# Patient Record
Sex: Female | Born: 1980 | Race: White | Hispanic: No | Marital: Single | Smoking: Former smoker
Health system: Southern US, Community
[De-identification: ages and names within clinical notes are randomized; demographics above are authoritative.]

## PROBLEM LIST (undated history)

## (undated) DIAGNOSIS — J40 Bronchitis, not specified as acute or chronic: Secondary | ICD-10-CM

## (undated) DIAGNOSIS — K8020 Calculus of gallbladder without cholecystitis without obstruction: Secondary | ICD-10-CM

## (undated) DIAGNOSIS — F419 Anxiety disorder, unspecified: Secondary | ICD-10-CM

## (undated) DIAGNOSIS — B379 Candidiasis, unspecified: Secondary | ICD-10-CM

## (undated) HISTORY — DX: Candidiasis, unspecified: B37.9

## (undated) HISTORY — PX: TUBAL LIGATION: SHX77

---

## 2001-12-04 ENCOUNTER — Emergency Department (HOSPITAL_COMMUNITY): Admission: EM | Admit: 2001-12-04 | Discharge: 2001-12-04 | Payer: Self-pay | Admitting: *Deleted

## 2002-08-14 ENCOUNTER — Other Ambulatory Visit: Admission: RE | Admit: 2002-08-14 | Discharge: 2002-08-14 | Payer: Self-pay | Admitting: *Deleted

## 2002-11-22 ENCOUNTER — Observation Stay (HOSPITAL_COMMUNITY): Admission: EM | Admit: 2002-11-22 | Discharge: 2002-11-22 | Payer: Self-pay | Admitting: Emergency Medicine

## 2002-11-22 ENCOUNTER — Encounter: Payer: Self-pay | Admitting: *Deleted

## 2003-02-07 ENCOUNTER — Encounter: Payer: Self-pay | Admitting: *Deleted

## 2003-02-07 ENCOUNTER — Ambulatory Visit (HOSPITAL_COMMUNITY): Admission: RE | Admit: 2003-02-07 | Discharge: 2003-02-07 | Payer: Self-pay | Admitting: *Deleted

## 2003-04-01 ENCOUNTER — Ambulatory Visit (HOSPITAL_COMMUNITY): Admission: RE | Admit: 2003-04-01 | Discharge: 2003-04-01 | Payer: Self-pay | Admitting: *Deleted

## 2003-04-01 ENCOUNTER — Encounter: Payer: Self-pay | Admitting: *Deleted

## 2003-04-21 ENCOUNTER — Ambulatory Visit (HOSPITAL_COMMUNITY): Admission: AD | Admit: 2003-04-21 | Discharge: 2003-04-21 | Payer: Self-pay | Admitting: *Deleted

## 2003-06-26 ENCOUNTER — Inpatient Hospital Stay (HOSPITAL_COMMUNITY): Admission: AD | Admit: 2003-06-26 | Discharge: 2003-06-28 | Payer: Self-pay | Admitting: *Deleted

## 2003-10-25 ENCOUNTER — Emergency Department (HOSPITAL_COMMUNITY): Admission: EM | Admit: 2003-10-25 | Discharge: 2003-10-25 | Payer: Self-pay | Admitting: Emergency Medicine

## 2004-03-09 ENCOUNTER — Emergency Department (HOSPITAL_COMMUNITY): Admission: EM | Admit: 2004-03-09 | Discharge: 2004-03-09 | Payer: Self-pay | Admitting: Emergency Medicine

## 2005-12-17 ENCOUNTER — Emergency Department (HOSPITAL_COMMUNITY): Admission: EM | Admit: 2005-12-17 | Discharge: 2005-12-17 | Payer: Self-pay | Admitting: Emergency Medicine

## 2005-12-18 ENCOUNTER — Emergency Department (HOSPITAL_COMMUNITY): Admission: EM | Admit: 2005-12-18 | Discharge: 2005-12-18 | Payer: Self-pay | Admitting: Family Medicine

## 2006-04-15 ENCOUNTER — Emergency Department (HOSPITAL_COMMUNITY): Admission: EM | Admit: 2006-04-15 | Discharge: 2006-04-15 | Payer: Self-pay | Admitting: Family Medicine

## 2008-03-01 ENCOUNTER — Emergency Department (HOSPITAL_COMMUNITY): Admission: EM | Admit: 2008-03-01 | Discharge: 2008-03-01 | Payer: Self-pay | Admitting: Emergency Medicine

## 2008-07-08 ENCOUNTER — Other Ambulatory Visit: Admission: RE | Admit: 2008-07-08 | Discharge: 2008-07-08 | Payer: Self-pay | Admitting: Obstetrics & Gynecology

## 2008-10-18 ENCOUNTER — Emergency Department (HOSPITAL_COMMUNITY): Admission: EM | Admit: 2008-10-18 | Discharge: 2008-10-18 | Payer: Self-pay | Admitting: Emergency Medicine

## 2010-06-08 ENCOUNTER — Emergency Department (HOSPITAL_COMMUNITY): Admission: EM | Admit: 2010-06-08 | Discharge: 2010-06-08 | Payer: Self-pay | Admitting: Emergency Medicine

## 2011-01-06 LAB — BASIC METABOLIC PANEL
BUN: 13 mg/dL (ref 6–23)
CO2: 24 mEq/L (ref 19–32)
Calcium: 8.9 mg/dL (ref 8.4–10.5)
Chloride: 107 mEq/L (ref 96–112)
Creatinine, Ser: 0.59 mg/dL (ref 0.4–1.2)
GFR calc Af Amer: >60 mL/min (ref >60)
GFR calc non Af Amer: >60 mL/min (ref >60)
Glucose, Bld: 127 mg/dL — ABNORMAL HIGH (ref 70–99)
Potassium: 3.7 mEq/L (ref 3.5–5.1)
Sodium: 136 mEq/L (ref 135–145)

## 2011-01-06 LAB — URINALYSIS, ROUTINE W REFLEX MICROSCOPIC
Bilirubin Urine: NEGATIVE
Glucose, UA: NEGATIVE mg/dL
Hgb urine dipstick: NEGATIVE
Ketones, ur: NEGATIVE mg/dL
Nitrite: NEGATIVE
Protein, ur: NEGATIVE mg/dL
Specific Gravity, Urine: 1.015 (ref 1.005–1.030)
Urobilinogen, UA: 2 mg/dL — ABNORMAL HIGH (ref 0.0–1.0)
pH: 7.5 (ref 5.0–8.0)

## 2011-01-06 LAB — URINE MICROSCOPIC-ADD ON

## 2011-03-11 NOTE — H&P (Signed)
NAME:  Diana Holmes, Diana Holmes                       ACCOUNT NO.:  [id number]   MEDICAL RECORD NO.:  [id number]                   PATIENT TYPE:  INP   LOCATION:  A426                                 FACILITY:  [facility]   PHYSICIAN:  Langley Gauss, M.D.                DATE OF BIRTH:  1980-11-28   DATE OF ADMISSION:  06/26/2003  DATE OF DISCHARGE:  06/28/2003                                HISTORY & PHYSICAL   HISTORY OF PRESENT ILLNESS:  A 30 year old gravida 3 para 2 at 39+ weeks  gestation is admitted for induction of labor secondary to psychosocial  factors.  In addition, the patient is having prodromal labor symptoms with  onset of uterine contractions.  The patient originally was scheduled for  induction on June 26, 2003 but she did present on this day in the early  stages of labor.  The patient's prenatal course complicated by findings of a  positive GC culture in her boyfriend.  The boyfriend did notify her of this  and thus the patient was treated with Rocephin 250 mg IM x1 on April 24, 2003.  She, however, on that date her cervix tested negative for GC and chlamydia.  The patient is a nonsmoker.  She is B negative blood type.   PAST MEDICAL HISTORY:   ALLERGIES:  No known drug allergies.   CURRENT MEDICATIONS:  Prenatal vitamins.   OBSTETRICAL HISTORY:  1. Vaginal delivery, 7 pound 8 ounce infant, October 2000.  2. October 2001, vaginal delivery, 6 pound 6 ounce infant.   SOCIAL HISTORY:  Nonsmoker.  She would like to utilize the patch.  She will  plan on breastfeeding and utilize Dr. Milford Cage for pediatric newborn purposes.   PHYSICAL EXAMINATION:  GENERAL:  Pleasant white female.  VITAL SIGNS:  Blood pressure 123/81, pulse 102, respiratory rate is 18.  HEENT:  Negative, no adenopathy.  NECK:  Supple.  Thyroid is nonpalpable.  LUNGS:  Clear.  CARDIOVASCULAR:  Regular rate and rhythm.  ABDOMEN:  Soft and nontender.  No surgical scars are identified.  She is  vertex  presentation by Leopold's maneuvers with a fundal height of 36 cm.  PELVIC:  Normal external genitalia.  No lesions or ulcerations identified,  no vaginal bleeding or leakage of fluid.  Cervix 3 cm dilated, 80% effaced,  0 station, vertex presentation confirmed.  External fetal monitor reveals a  reassuring fetal heart rate.    ASSESSMENT:  Plan on proceeding with amniotomy, placing of fetal scalp  electrode during labor.  The patient has had epidural with previous labor  and delivery and she would like placement of epidural with onset of active  labor.  Langley Gauss, M.D.    DC/MEDQ  D:  07/03/2003  T:  07/03/2003  Job:  [id number]

## 2011-03-11 NOTE — Op Note (Signed)
   NAME:  Diana Holmes, Diana Holmes                       ACCOUNT NO.:  [id number]   MEDICAL RECORD NO.:  [id number]                   PATIENT TYPE:  INP   LOCATION:  A426                                 FACILITY:  [facility]   PHYSICIAN:  Langley Gauss, M.D.                DATE OF BIRTH:  [birth year]   DATE OF PROCEDURE:  06/26/2003  DATE OF DISCHARGE:                                 OPERATIVE REPORT   PROCEDURE:  Epidural.   DESCRIPTION OF PROCEDURE:  Appropriate informed consent was obtained.  The  patient was in the seated position.  Bony landmarks were identified.  The  patient was sterilely prepped and draped at the L2-L3 interspace, and 5 cc  of 1% lidocaine was injected at this site to raise a small skin wheal.  The  17-gauge Tuohy-Schliff needle was then utilized with loss of resistance and  an air filled glass syringe to identify entry into the epidural space on the  first attempt without difficulty.  Initial test dose of 5 cc of 1.5%  lidocaine plus epinephrine was injected through the epidural needle.  The  epidural catheter was then inserted to a depth of 5 cm.  The needle was  removed.  Aspiration test was negative.  A second test dose of 2 cc of 1.5%  lidocaine plus epinephrine was injected through the epidural catheter.  No  signs of CSF or intravascular injection obtained, thus, the patient was  connected to the infusion pump containing the standard mixture.  She will be  treated with a bolus of 10 cc followed by continuous infusion rate of 12 cc  per hour thereafter.  The catheter was secured into place.  The patient was  returned to the lateral supine position at which time she was having  evidence of an adequate bilateral block setting up.  Fetal heart rate  remained reassuring.                                               Langley Gauss, M.D.    DC/MEDQ  D:  06/26/2003  T:  06/27/2003  Job:  [id number]

## 2011-03-11 NOTE — Discharge Summary (Signed)
   NAME:  Diana Holmes, Couse                       ACCOUNT NO.:  [id number]   MEDICAL RECORD NO.:  [id number]                   PATIENT TYPE:  INP   LOCATION:  A426                                 FACILITY:  [facility]   PHYSICIAN:  Langley Gauss, M.D.                DATE OF BIRTH:  Feb 24, 1981   DATE OF ADMISSION:  06/26/2003  DATE OF DISCHARGE:  06/28/2003                                 DISCHARGE SUMMARY   DIAGNOSIS:  Term pregnancy, delivered.   PROCEDURES:  Placement of epidural on June 26, 2003.   DISPOSITION:  At the time of discharge the patient is given a copy of  standarized discharge instructions.  She will follow up in the office in  four weeks' time to discuss birth control options.   DISCHARGE MEDICATIONS:  Vicoprofen is not available for this patient as she  states she has difficulty swallowing pills.  Thus, she is given a  prescription for Tylenol No. 3 elixir.   PERTINENT LABORATORY STUDIES:  Admission hemoglobin and hematocrit  11.1/31.9, with a white count of 11.4.  On postpartum day #1, 10.7/32 with a  white count of 14.5.  The patient is noted B negative.  She did receive  RhoGAM following delivery, and she in addition received RhoGAM during the  prenatal course.   HOSPITAL COURSE:  See previous dictations.  The patient delivered on  June 26, 2003, without difficulty.  Postpartum she did well.  She bonded  well with the infant, had no postpartum complications.  Thus, mother and  infant discharged home on June 28, 2003, to follow up as stated  previously.                                               Langley Gauss, M.D.    DC/MEDQ  D:  07/03/2003  T:  07/04/2003  Job:  [id number]

## 2011-03-11 NOTE — Discharge Summary (Signed)
   NAME:  Diana Holmes, Diana Holmes                       ACCOUNT NO.:  [id number]   MEDICAL RECORD NO.:  [id number]                   PATIENT TYPE:  OBV   LOCATION:  A418                                 FACILITY:  [facility]   PHYSICIAN:  Langley Gauss, M.D.                DATE OF BIRTH:  02/17/1981   DATE OF ADMISSION:  11/21/2002  DATE OF DISCHARGE:                                 DISCHARGE SUMMARY   HISTORY OF PRESENT ILLNESS:  This is a 30 year old white female gravida 2,  para 1 who presents to the emergency room with two day duration of back pain  radiating up into the shoulder blades.  In addition, she complains of some  uterine cramping.  The patient was seen in the emergency room by Hilario Quarry, M.D.  Due to the pelvic pain in pregnancy and inability to have an  ultrasound performed after hours, patient was referred to the fourth floor  on an observation status.  I did not see the patient until November 22, 2002  at which time abdomen and uterus noted to be soft and nontender.  She has no  complaints of abdominal pain at this time.  Laboratory studies reviewed.  Hemoglobin 13.0, hematocrit 37.4.  Urinalysis is negative.  Quantitative  beta hCG requested by the emergency room physician [phone].  Transabdominal  and transvaginal ultrasounds were performed in the department of radiology  the a.m. of November 22, 2002 which reveals a normal intrauterine pregnancy 8  weeks and 1 day duration.  The patient is thus discharged to home on today's  date of evaluation.  She does have an appointment in the office for three  days' time.  Discharge medications are prenatal vitamins.  In addition,  patient is given medical excuse to restrict activity to less than 30 pounds  lifting during the pregnancy.                                               Langley Gauss, M.D.    DC/MEDQ  D:  11/22/2002  T:  11/22/2002  Job:  [id number]

## 2011-03-11 NOTE — Op Note (Signed)
NAME:  Diana Holmes, Diana Holmes                       ACCOUNT NO.:  [id number]   MEDICAL RECORD NO.:  [id number]                   PATIENT TYPE:  INP   LOCATION:  A426                                 FACILITY:  [facility]   PHYSICIAN:  Langley Gauss, M.D.                DATE OF BIRTH:  1981/05/27   DATE OF PROCEDURE:  06/26/2003  DATE OF DISCHARGE:                                 OPERATIVE REPORT   PREOPERATIVE DIAGNOSES:  1. A 39-week intrauterine pregnancy.  2. Dysfunctional labor requiring Pitocin augmentation secondary to     inadequate frequency and intensity of uterine contractions.   PROCEDURE:  1. Placement of continuous lumbar epidural analgesia.  2. Outlet vacuum extraction of a 6 pound 13 ounce female infant.  3. Midline episiotomy repair.   SURGEON:  Langley Gauss, M.D.   ESTIMATED BLOOD LOSS:  Less than 500 cc.   COMPLICATIONS:  None.   SPECIMENS:  Arterial cord gas and cord blood to the laboratory.  The  placenta was examined and noted to be apparently intact with a three-vessel  umbilical cord.   SUMMARY:  The patient presented in the a.m. of 06/26/2003 and noted to be  contracting every three to five minutes.  Amniotomy was performed with  findings of clear amniotic fluid.  Fetal scalp electrode documented a  reassuring fetal heart rate.  With onset of active labor, the patient  requested epidural analgesic which was given without difficulty.  Following  placement of the epidural, the patient was noted to be 5 cm dilated.  After  reaching 5 cm in the active phase of labor, the patient progressed very  rapidly along the labor curve to complete dilatation.  With a strong urge to  push, the patient was placed in the dorsal lithotomy position and prepped  and draped in the usual sterile manner.  The Foley catheter was removed.  The descent of the vertex near the perineal floor, the patient had very poor  expulsive efforts.  She had a very strong motor block from the  epidural.  She was becoming frustrated with her pushing efforts.  Thus, with the vertex  in a direct OA position on the perineal floor, an outlet vacuum extraction  was performed utilizing the Kiwi vacuum extractor.  A total of 30 cc of 1%  lidocaine plain was injected in the perineal body, and a small midline  episiotomy performed.  Excellent perineal support was right, as the infant  delivered in a direct OA position over a midline episiotomy without  extension.  The mouth and nares were bulb suctioned of clear amniotic fluid.  Renewed expulsive efforts with spontaneous rotation to a right anterior  ______ position, gentle downward traction combined with expulsive efforts  resulted in delivery of the remainder of the infant without difficulty.  The  umbilical cord was milked towards the infant.  The cord was doubly clamped  and cut.  The  infant was handed and placed on the maternal abdomen for  immediate bonding purposes.  Arterial cord blood was then obtained from the  umbilical cord.  Gentle traction of the umbilical cord resulted in  separation which upon examination appeared to be an intact placenta with  associated three-vessel umbilical cord.  Excellent uterine tone was achieved  following delivery of the placenta.  Examination revealed no extension of  the episiotomy, and no lacerations were noted.  The episiotomy was easily  repaired with 0 chromic in a running locked fashion on the abdomen proper  followed by a two-layer closure with 0 chromic on the perineal body.  The  mother and infant were doing very well following delivery.  The patient was  taken out of the dorsal lithotomy position and rolled to her side at which  time the epidural catheter was removed.                                               Langley Gauss, M.D.    DC/MEDQ  D:  06/26/2003  T:  06/27/2003  Job:  [id number]

## 2011-07-28 LAB — URINE CULTURE: Colony Count: 75000

## 2011-07-28 LAB — URINE MICROSCOPIC-ADD ON

## 2011-07-28 LAB — URINALYSIS, ROUTINE W REFLEX MICROSCOPIC
Glucose, UA: NEGATIVE mg/dL
Protein, ur: NEGATIVE mg/dL
pH: 7 (ref 5.0–8.0)

## 2011-09-09 ENCOUNTER — Emergency Department (HOSPITAL_COMMUNITY)
Admission: EM | Admit: 2011-09-09 | Discharge: 2011-09-10 | Disposition: A | Discharge status: Home / Self Care | Payer: Medicaid Other | Attending: Emergency Medicine | Admitting: Emergency Medicine

## 2011-09-09 ENCOUNTER — Encounter: Payer: Self-pay | Admitting: *Deleted

## 2011-09-09 DIAGNOSIS — R059 Cough, unspecified: Secondary | ICD-10-CM | POA: Insufficient documentation

## 2011-09-09 DIAGNOSIS — H9209 Otalgia, unspecified ear: Secondary | ICD-10-CM | POA: Insufficient documentation

## 2011-09-09 DIAGNOSIS — Z79899 Other long term (current) drug therapy: Secondary | ICD-10-CM | POA: Insufficient documentation

## 2011-09-09 DIAGNOSIS — J3489 Other specified disorders of nose and nasal sinuses: Secondary | ICD-10-CM | POA: Insufficient documentation

## 2011-09-09 DIAGNOSIS — R51 Headache: Secondary | ICD-10-CM | POA: Insufficient documentation

## 2011-09-09 DIAGNOSIS — R05 Cough: Secondary | ICD-10-CM | POA: Insufficient documentation

## 2011-09-09 NOTE — ED Notes (Signed)
C/o cough, congestion, bilateral ear pain, chest pain with coughing, was seen by urgent care placed on antibodics and given tylenol has not gotten any better, cough is non productive,

## 2011-09-10 MED ORDER — GUAIFENESIN-CODEINE 100-10 MG/5ML PO SYRP: ORAL_SOLUTION | ORAL | Status: DC

## 2011-09-10 MED ORDER — GUAIFENESIN-CODEINE 100-10 MG/5ML PO SOLN
5.0000 mL | Freq: Once | ORAL | Status: AC
  Administered 2011-09-10: 5 mL via ORAL
  Filled 2011-09-10: qty 5

## 2011-09-10 MED ORDER — PREDNISONE 10 MG PO TABS: 20.0000 mg | ORAL_TABLET | Freq: Every day | ORAL | Status: AC

## 2011-09-10 MED ORDER — PREDNISONE 20 MG PO TABS
60.0000 mg | ORAL_TABLET | Freq: Once | ORAL | Status: AC
  Administered 2011-09-10: 60 mg via ORAL
  Filled 2011-09-10: qty 3

## 2011-09-10 MED ORDER — ALBUTEROL SULFATE (5 MG/ML) 0.5% IN NEBU
2.5000 mg | INHALATION_SOLUTION | Freq: Once | RESPIRATORY_TRACT | Status: AC
  Administered 2011-09-10: 2.5 mg via RESPIRATORY_TRACT
  Filled 2011-09-10: qty 0.5

## 2011-09-10 NOTE — ED Provider Notes (Signed)
History     CSN: [id number] Arrival date & time: 09/09/2011 11:37 PM   First MD Initiated Contact with Patient 09/09/11 2340      Chief Complaint  Patient presents with  . Cough  . Nasal Congestion  . Otalgia    (Consider location/radiation/quality/duration/timing/severity/associated sxs/prior treatment) HPI Comments: Mickelle R Ruscitti is a 30 y.o. female who presents to the Emergency Department complaining of cough that she has had for a week. She has been seen at Urgent Care, started on antibiotics, and has not seen an improvement in the cough. She denies fever, chills, nausea, vomiting. She denies wheezing. She has had shortness  of breath associated with the cough. She has taken tylenol with no relief  of the cough.Cough is worse when lying down.  Patient is a 30 y.o. female presenting with cough and ear pain. The history is provided by the patient.  Cough This is a new problem. The current episode started more than 1 week ago. The problem occurs every few minutes. The cough is non-productive. There has been no fever. Associated symptoms include ear pain and headaches. Pertinent negatives include no shortness of breath and no wheezing. Treatments tried: antibiotics.  Otalgia Associated symptoms include headaches and cough.    History reviewed. No pertinent past medical history.  History reviewed. No pertinent past surgical history.  History reviewed. No pertinent family history.  History  Substance Use Topics  . Smoking status: Never Smoker   . Smokeless tobacco: Not on file  . Alcohol Use: No    OB History    Grav Para Term Preterm Abortions TAB SAB Ect Mult Living                  Review of Systems  HENT: Positive for ear pain.   Respiratory: Positive for cough. Negative for shortness of breath and wheezing.   Neurological: Positive for headaches.  All other systems reviewed and are negative.    Allergies  Review of patient's allergies indicates no known  allergies.  Home Medications   Current Outpatient Rx  Name Route Sig Dispense Refill  . ACETAMINOPHEN 500 MG PO TABS Oral Take 500 mg by mouth every 4 (four) hours as needed.      . AMOXICILLIN 500 MG PO CAPS Oral Take 1,500 mg by mouth 2 (two) times daily.      Marland Kitchen PROMETHAZINE HCL 25 MG RE SUPP Rectal Place 25 mg rectally every 8 (eight) hours as needed.        BP 150/79  Pulse 109  Temp(Src) 99.5 F (37.5 C) (Oral)  Resp 24  Ht 5\' 3"  (1.6 m)  Wt 199 lb (90.266 kg)  BMI 35.25 kg/m2  SpO2 100%  LMP 07/10/2011  Physical Exam  Nursing note and vitals reviewed. Constitutional: She is oriented to person, place, and time. She appears well-developed and well-nourished.  HENT:  Head: Normocephalic.  Right Ear: External ear normal.  Left Ear: External ear normal.  Eyes: EOM are normal.  Neck: Normal range of motion.  Cardiovascular: Normal rate, normal heart sounds and intact distal pulses.   Pulmonary/Chest: Effort normal and breath sounds normal. No respiratory distress. She has no wheezes. She has no rales. She exhibits no tenderness.  Abdominal: Soft. Bowel sounds are normal.  Musculoskeletal: Normal range of motion.  Neurological: She is alert and oriented to person, place, and time. She has normal reflexes.  Skin: Skin is warm and dry.    ED Course  Procedures (including critical care  time)     MDM  Patient with URI, on antibiotics with a persistent cough. Given albuterol, prednisone and robitussin AC with improvement. Pt stable in ED with no significant deterioration in condition.The patient appears reasonably screened and/or stabilized for discharge and I doubt any other medical condition or other [facility] requiring further screening, evaluation, or treatment in the ED at this time prior to discharge.  MDM Reviewed: nursing note and vitals          Nicoletta Dress. Colon Branch, MD 09/10/11 [place] Manners

## 2011-09-10 NOTE — ED Notes (Signed)
See triage note.

## 2012-04-27 ENCOUNTER — Emergency Department (HOSPITAL_COMMUNITY)
Admission: EM | Admit: 2012-04-27 | Discharge: 2012-04-28 | Disposition: A | Discharge status: Home / Self Care | Payer: Medicaid Other | Attending: Emergency Medicine | Admitting: Emergency Medicine

## 2012-04-27 ENCOUNTER — Encounter (HOSPITAL_COMMUNITY): Payer: Self-pay | Admitting: *Deleted

## 2012-04-27 ENCOUNTER — Emergency Department (HOSPITAL_COMMUNITY): Payer: Medicaid Other

## 2012-04-27 DIAGNOSIS — M545 Low back pain, unspecified: Secondary | ICD-10-CM | POA: Insufficient documentation

## 2012-04-27 DIAGNOSIS — R209 Unspecified disturbances of skin sensation: Secondary | ICD-10-CM | POA: Insufficient documentation

## 2012-04-27 DIAGNOSIS — R202 Paresthesia of skin: Secondary | ICD-10-CM

## 2012-04-27 DIAGNOSIS — R42 Dizziness and giddiness: Secondary | ICD-10-CM | POA: Insufficient documentation

## 2012-04-27 LAB — POCT I-STAT, CHEM 8
Glucose, Bld: 102 mg/dL — ABNORMAL HIGH (ref 70–99)
HCT: 38 % (ref 36.0–46.0)
Hemoglobin: 12.9 g/dL (ref 12.0–15.0)
Potassium: 3.4 mEq/L — ABNORMAL LOW (ref 3.5–5.1)
Sodium: 141 mEq/L (ref 135–145)

## 2012-04-27 LAB — URINE MICROSCOPIC-ADD ON

## 2012-04-27 LAB — URINALYSIS, ROUTINE W REFLEX MICROSCOPIC
Bilirubin Urine: NEGATIVE
Nitrite: NEGATIVE
Specific Gravity, Urine: <1.005 — ABNORMAL LOW (ref 1.005–1.030)
Urobilinogen, UA: 0.2 mg/dL (ref 0.0–1.0)

## 2012-04-27 MED ORDER — POTASSIUM CHLORIDE 20 MEQ/15ML (10%) PO LIQD
40.0000 meq | Freq: Once | ORAL | Status: AC
  Administered 2012-04-27: 40 meq via ORAL
  Filled 2012-04-27: qty 30

## 2012-04-27 NOTE — ED Notes (Signed)
Pt c/o dizzy "spells" that started on April 13 2012, was seen at [facility] department, had blood work performed, unsure of what type of blood work was done or what the results were, pt states that on 04/19/2012 she began to experience numbness and tingling that starts at right great toe area and radiates up to right thigh area. States that the numbness and tingling comes and goes along with the dizziness.

## 2012-04-28 MED ORDER — ACETAMINOPHEN 500 MG PO TABS
1000.0000 mg | ORAL_TABLET | Freq: Once | ORAL | Status: AC
  Administered 2012-04-28: 1000 mg via ORAL
  Filled 2012-04-28: qty 2

## 2012-04-28 MED ORDER — NAPROXEN 250 MG PO TABS: 250.0000 mg | ORAL_TABLET | Freq: Two times a day (BID) | ORAL | Status: DC

## 2012-04-28 MED ORDER — IBUPROFEN 400 MG PO TABS
400.0000 mg | ORAL_TABLET | Freq: Once | ORAL | Status: AC
  Administered 2012-04-28: 400 mg via ORAL
  Filled 2012-04-28: qty 1

## 2012-04-28 NOTE — ED Provider Notes (Signed)
History     CSN: [id number]  Arrival date & time 04/27/12  2148   First MD Initiated Contact with Patient 04/27/12 2216      Chief Complaint  Patient presents with  . Dizziness    HPI Pt was seen at 2225.  Per pt, c/o gradual onset and persistence of intermittent multiple symptoms for the past 2 weeks.  States she has intermittent right dorsal great toe "tingling" which radiates up to her right anterior thigh and "makes me dizzy." Pt describes the dizziness as "lightheadedness."  Pt [facility] for [facility] but "can't remember what they told me."  Denies CP/palpitations, no SOB/cough, no abd pain, no N/V/D, no fevers, no focal motor weakness, no rash, no injury.    History reviewed. No pertinent past medical history.  History reviewed. No pertinent past surgical history.   History  Substance Use Topics  . Smoking status: Never Smoker   . Smokeless tobacco: Not on file  . Alcohol Use: No    Review of Systems ROS: Statement: All systems negative except as marked or noted in the HPI; Constitutional: Negative for fever and chills. ; ; Eyes: Negative for eye pain, redness and discharge. ; ; ENMT: Negative for ear pain, hoarseness, nasal congestion, sinus pressure and sore throat. ; ; Cardiovascular: Negative for chest pain, palpitations, diaphoresis, dyspnea and peripheral edema. ; ; Respiratory: Negative for cough, wheezing and stridor. ; ; Gastrointestinal: Negative for nausea, vomiting, diarrhea, abdominal pain, blood in stool, hematemesis, jaundice and rectal bleeding. . ; ; Genitourinary: Negative for dysuria, flank pain and hematuria. ; ; Musculoskeletal: Negative for back pain and neck pain. Negative for swelling and trauma.; ; Skin: Negative for pruritus, rash, abrasions, blisters, bruising and skin lesion.; ; Neuro: +lightheadedness, paresthesias.  Negative for headache and neck stiffness. Negative for weakness, altered level of consciousness , altered mental status,  extremity weakness, involuntary movement, seizure and syncope.     Allergies  Review of patient's allergies indicates no known allergies.  Home Medications   Current Outpatient Rx  Name Route Sig Dispense Refill  . ACETAMINOPHEN 160 MG/5ML PO SUSP Oral Take 320 mg by mouth as needed. For pain    . ETONOGESTREL 68 MG [place] IMPL Subcutaneous Inject 1 each into the skin once.      BP 129/81  Pulse 103  Temp 98.7 F (37.1 C) (Oral)  Resp 20  Ht 5\' 3"  (1.6 m)  Wt 201 lb (91.173 kg)  BMI 35.61 kg/m2  SpO2 100%  LMP 04/16/2012  Physical Exam 2230: Physical examination:  Nursing notes reviewed; Vital signs and O2 SAT reviewed;  Constitutional: Well developed, Well nourished, Well hydrated, In no acute distress; Head:  Normocephalic, atraumatic; Eyes: EOMI, PERRL, No scleral icterus; ENMT: Mouth and pharynx normal, Mucous membranes moist; Neck: Supple, Full range of motion, No lymphadenopathy; Cardiovascular: Regular rate and rhythm, No murmur, rub, or gallop; Respiratory: Breath sounds clear & equal bilaterally, No rales, rhonchi, wheezes.  Speaking full sentences with ease, Normal respiratory effort/excursion; Chest: Nontender, Movement normal; Abdomen: Soft, Nontender, Nondistended, Normal bowel sounds; Genitourinary: No CVA tenderness; Spine:  No midline CS, TS, LS tenderness.; Extremities: Pulses normal, No tenderness, RLE muscle compartments soft.  No rash, no ecchymosis. No edema, No calf edema or asymmetry.; Neuro: AA&Ox3, Major CN grossly intact.  Speech clear. No facial droop.  Climbs on and off stretcher easily by herself.  No gross focal motor or sensory deficits in extremities.; Skin: Color normal, Warm,  Dry.   ED Course  Procedures   MDM  MDM Reviewed: nursing note and vitals Interpretation: labs and x-ray   Results for orders placed during the hospital encounter of 04/27/12  PREGNANCY, URINE      Component Value Range   Preg Test, Ur NEGATIVE  NEGATIVE  URINALYSIS,  ROUTINE W REFLEX MICROSCOPIC      Component Value Range   Color, Urine YELLOW  YELLOW   APPearance CLEAR  CLEAR   Specific Gravity, Urine <1.005 (*) 1.005 - 1.030   pH 7.0  5.0 - 8.0   Glucose, UA NEGATIVE  NEGATIVE mg/dL   Hgb urine dipstick TRACE (*) NEGATIVE   Bilirubin Urine NEGATIVE  NEGATIVE   Ketones, ur NEGATIVE  NEGATIVE mg/dL   Protein, ur NEGATIVE  NEGATIVE mg/dL   Urobilinogen, UA 0.2  0.0 - 1.0 mg/dL   Nitrite NEGATIVE  NEGATIVE   Leukocytes, UA NEGATIVE  NEGATIVE  POCT I-STAT, CHEM 8      Component Value Range   Sodium 141  135 - 145 mEq/L   Potassium 3.4 (*) 3.5 - 5.1 mEq/L   Chloride 105  96 - 112 mEq/L   BUN 13  6 - 23 mg/dL   Creatinine, Ser 4.54  0.50 - 1.10 mg/dL   Glucose, Bld 098 (*) 70 - 99 mg/dL   Calcium, Ion 1.19  1.47 - 1.23 mmol/L   TCO2 21  0 - 100 mmol/L   Hemoglobin 12.9  12.0 - 15.0 g/dL   HCT 82.9  56.2 - 13.0 %  URINE MICROSCOPIC-ADD ON      Component Value Range   Squamous Epithelial / LPF FEW (*) RARE   WBC, UA 0-2  <3 WBC/hpf   RBC / HPF 0-2  <3 RBC/hpf   Bacteria, UA RARE  RARE   Dg Lumbar Spine Complete 04/28/2012  *RADIOLOGY REPORT*  Clinical Data: Lower back pain, with right great toe and leg numbness.  LUMBAR SPINE - COMPLETE 4+ VIEW  Comparison: None.  Findings: There is no evidence of fracture or subluxation. Vertebral bodies demonstrate normal height and alignment. Intervertebral disc spaces are preserved.  The visualized neural foramina are grossly unremarkable in appearance.  The visualized bowel gas pattern is unremarkable in appearance; air and stool are noted within the colon.  The sacroiliac joints are within normal limits.  IMPRESSION: No evidence of fracture or subluxation along the lumbar spine.  Original Report Authenticated By: Tonia Ghent, M.D.      12:27 AM:  Pt not orthostatic per VS.  Potassium repleted PO.  No acute findings on workup today.  Will treat pain symptomatically at this time. Wants to go home now.  Dx  testing d/w pt and family.  Questions answered.  Verb understanding, agreeable to d/c home with outpt f/u.  The patient appears reasonably screened and/or stabilized for discharge and I doubt any other medical condition or other [facility] requiring further screening, evaluation, or treatment in the ED at this time prior to discharge.          Laray Anger, DO 04/30/12 1307

## 2012-05-24 ENCOUNTER — Encounter (HOSPITAL_COMMUNITY): Payer: Self-pay | Admitting: *Deleted

## 2012-05-24 ENCOUNTER — Emergency Department (HOSPITAL_COMMUNITY): Payer: Medicaid Other

## 2012-05-24 ENCOUNTER — Emergency Department (HOSPITAL_COMMUNITY)
Admission: EM | Admit: 2012-05-24 | Discharge: 2012-05-24 | Disposition: A | Discharge status: Home / Self Care | Payer: Medicaid Other | Attending: Emergency Medicine | Admitting: Emergency Medicine

## 2012-05-24 DIAGNOSIS — J189 Pneumonia, unspecified organism: Secondary | ICD-10-CM

## 2012-05-24 HISTORY — DX: Bronchitis, not specified as acute or chronic: J40

## 2012-05-24 MED ORDER — HYDROCOD POLST-CHLORPHEN POLST 10-8 MG/5ML PO LQCR
5.0000 mL | Freq: Once | ORAL | Status: AC
  Administered 2012-05-24: 5 mL via ORAL
  Filled 2012-05-24: qty 5

## 2012-05-24 MED ORDER — ALBUTEROL SULFATE HFA 108 (90 BASE) MCG/ACT IN AERS
2.0000 | INHALATION_SPRAY | RESPIRATORY_TRACT | Status: DC
Start: 2012-05-24 — End: 2012-05-24
  Administered 2012-05-24: 2 via RESPIRATORY_TRACT
  Filled 2012-05-24: qty 6.7

## 2012-05-24 MED ORDER — LEVOFLOXACIN 500 MG PO TABS
500.0000 mg | ORAL_TABLET | Freq: Every day | ORAL | Status: DC
  Administered 2012-05-24: 500 mg via ORAL
  Filled 2012-05-24: qty 1

## 2012-05-24 MED ORDER — IBUPROFEN 800 MG PO TABS
800.0000 mg | ORAL_TABLET | Freq: Once | ORAL | Status: AC
  Administered 2012-05-24: 800 mg via ORAL
  Filled 2012-05-24: qty 1

## 2012-05-24 MED ORDER — PROMETHAZINE-CODEINE 6.25-10 MG/5ML PO SYRP: 5.0000 mL | ORAL_SOLUTION | ORAL | Status: AC | PRN

## 2012-05-24 MED ORDER — LEVOFLOXACIN 500 MG PO TABS: 500.0000 mg | ORAL_TABLET | Freq: Every day | ORAL | Status: AC

## 2012-05-24 NOTE — ED Notes (Signed)
Took meds without difficulty --awaiting resp therapy for inhaler education

## 2012-05-24 NOTE — ED Provider Notes (Signed)
History     CSN: [id number]  Arrival date & time 05/24/12  1210   First MD Initiated Contact with Patient 05/24/12 1344      Chief Complaint  Patient presents with  . Cough    (Consider location/radiation/quality/duration/timing/severity/associated sxs/prior treatment) HPI Comments: Patient states she's been having problem with cough and congestion for nearly a week. She was seen by the [facility] where she was told she had a bronchitis she was placed on Bactrim. The patient continued to worsen she attempted to go back to the health department but they were unable to see her so she came here to the emergency department. The patient complains of temperature elevation as high as 101.3. Her cough is nonproductive. She is now however having pain in her right neck and shoulder area as well as some problems with headache. She has also tried cough medication from over-the-counter but this is not helping. Patient presents for evaluation and management of these problems.  Patient is a 31 y.o. female presenting with cough. The history is provided by the patient.  Cough This is a new problem. The problem occurs constantly. The problem has been gradually worsening. The cough is non-productive. The maximum temperature recorded prior to her arrival was 101 to 101.9 F. Associated symptoms include chest pain, chills, sweats, headaches and myalgias. She has tried cough syrup for the symptoms. She is not a smoker. Her past medical history is significant for bronchitis.    Past Medical History  Diagnosis Date  . Bronchitis     History reviewed. No pertinent past surgical history.  History reviewed. No pertinent family history.  History  Substance Use Topics  . Smoking status: Never Smoker   . Smokeless tobacco: Not on file  . Alcohol Use: No    OB History    Grav Para Term Preterm Abortions TAB SAB Ect Mult Living                  Review of Systems  Constitutional:  Positive for chills.  Respiratory: Positive for cough.   Cardiovascular: Positive for chest pain.  Musculoskeletal: Positive for myalgias.  Neurological: Positive for headaches.    Allergies  Review of patient's allergies indicates no known allergies.  Home Medications   Current Outpatient Rx  Name Route Sig Dispense Refill  . ACETAMINOPHEN 160 MG/5ML PO SUSP Oral Take 320 mg by mouth as needed. For pain    . SULFAMETHOXAZOLE-TMP DS 800-160 MG PO TABS Oral Take 1 tablet by mouth 2 (two) times daily before a meal.    . ETONOGESTREL 68 MG [place] IMPL Subcutaneous Inject 1 each into the skin once.      BP 119/44  Pulse 105  Temp 99 F (37.2 C) (Oral)  Resp 20  Ht 5\' 4"  (1.626 m)  Wt 204 lb (92.534 kg)  BMI 35.02 kg/m2  SpO2 98%  LMP 05/16/2012  Physical Exam  Nursing note and vitals reviewed. Constitutional: She is oriented to person, place, and time. She appears well-developed and well-nourished.  Non-toxic appearance.  HENT:  Head: Normocephalic.  Right Ear: Tympanic membrane and external ear normal.  Left Ear: Tympanic membrane and external ear normal.  Eyes: EOM and lids are normal. Pupils are equal, round, and reactive to light.  Neck: Normal range of motion. Neck supple. Carotid bruit is not present.  Cardiovascular: Regular rhythm, normal heart sounds, intact distal pulses and normal pulses.  Tachycardia present.   Pulmonary/Chest: Breath sounds normal. No respiratory  distress.       Bilateral rhonchi present. Decreased breath sounds on the right.  Abdominal: Soft. Bowel sounds are normal. There is no tenderness. There is no guarding.  Musculoskeletal: Normal range of motion.       Pain with range of motion of the right shoulder in the upper back area.  Lymphadenopathy:       Head (right side): No submandibular adenopathy present.       Head (left side): No submandibular adenopathy present.    She has no cervical adenopathy.  Neurological: She is alert and oriented to  person, place, and time. She has normal strength. No cranial nerve deficit or sensory deficit.  Skin: Skin is warm and dry.  Psychiatric: She has a normal mood and affect. Her speech is normal.    ED Course  Procedures (including critical care time)  Labs Reviewed - No data to display Dg Chest 2 View  05/24/2012  *RADIOLOGY REPORT*  Clinical Data: Cough, shortness of breath  CHEST - 2 VIEW  Comparison: None.  Findings: Patchy right hilar airspace disease, which is posterior on the lateral view compatible with right lower lobe superior segment pneumonia.  Left lung remains clear.  No effusion or pneumothorax.  Trachea midline.  Normal heart size and vascularity. No abnormal or acute osseous finding.  IMPRESSION: Right lower lobe superior segment pneumonia.  Original Report Authenticated By: Judie Petit. Ruel Favors, M.D.     No diagnosis found.    MDM  I have reviewed nursing notes, vital signs, and all appropriate lab and imaging results for this patient. And the chest x-ray is consistent with right lower lobe pneumonia. Pulse oximetry is 98% on room air. The patient is treated with Levaquin daily, albuterol 2 puffs every 4 hours, promethazine cough medication every 4 hours for cough and congestion. Patient is to follow up with her primary physician in 5-6 days for recheck.       Kathie Dike, [place] 05/24/12 1511

## 2012-05-24 NOTE — ED Notes (Signed)
Pt reports having been dx w/ bronchitis, feels like she is not getting better, cont. To have cough and pain moves up shoulder and back at times, into neck.  Pt appear nad at current.

## 2012-05-24 NOTE — ED Notes (Signed)
Cough,nonproductive, ,  Dx with bronchitis by clinic.Pain rt side of neck and shoulder ,Headache.  Pain in rt shoulder and neck increases with movement.

## 2012-05-24 NOTE — ED Provider Notes (Signed)
Medical screening examination/treatment/procedure(s) were performed by non-physician practitioner and as supervising physician I was immediately available for consultation/collaboration.  Flint Melter, MD 05/24/12 1556

## 2013-01-02 ENCOUNTER — Emergency Department (HOSPITAL_COMMUNITY)
Admission: EM | Admit: 2013-01-02 | Discharge: 2013-01-02 | Disposition: A | Discharge status: Home / Self Care | Payer: Medicaid Other | Attending: Emergency Medicine | Admitting: Emergency Medicine

## 2013-01-02 ENCOUNTER — Emergency Department (HOSPITAL_COMMUNITY): Payer: Medicaid Other

## 2013-01-02 DIAGNOSIS — Z8709 Personal history of other diseases of the respiratory system: Secondary | ICD-10-CM | POA: Insufficient documentation

## 2013-01-02 DIAGNOSIS — R110 Nausea: Secondary | ICD-10-CM | POA: Insufficient documentation

## 2013-01-02 DIAGNOSIS — R197 Diarrhea, unspecified: Secondary | ICD-10-CM | POA: Insufficient documentation

## 2013-01-02 DIAGNOSIS — Z3202 Encounter for pregnancy test, result negative: Secondary | ICD-10-CM | POA: Insufficient documentation

## 2013-01-02 DIAGNOSIS — R42 Dizziness and giddiness: Secondary | ICD-10-CM | POA: Insufficient documentation

## 2013-01-02 DIAGNOSIS — R109 Unspecified abdominal pain: Secondary | ICD-10-CM | POA: Insufficient documentation

## 2013-01-02 DIAGNOSIS — F411 Generalized anxiety disorder: Secondary | ICD-10-CM | POA: Insufficient documentation

## 2013-01-02 LAB — BASIC METABOLIC PANEL
CO2: 26 mEq/L (ref 19–32)
Chloride: 100 mEq/L (ref 96–112)
Creatinine, Ser: 0.64 mg/dL (ref 0.50–1.10)
Glucose, Bld: 110 mg/dL — ABNORMAL HIGH (ref 70–99)

## 2013-01-02 LAB — URINALYSIS, ROUTINE W REFLEX MICROSCOPIC
Bilirubin Urine: NEGATIVE
Specific Gravity, Urine: 1.025 (ref 1.005–1.030)
Urobilinogen, UA: 0.2 mg/dL (ref 0.0–1.0)

## 2013-01-02 LAB — CBC
HCT: 42.4 % (ref 36.0–46.0)
Hemoglobin: 14.8 g/dL (ref 12.0–15.0)
MCH: 31.9 pg (ref 26.0–34.0)
MCV: 91.4 fL (ref 78.0–100.0)
Platelets: 358 10*3/uL (ref 150–400)
RBC: 4.64 MIL/uL (ref 3.87–5.11)
WBC: 16 10*3/uL — ABNORMAL HIGH (ref 4.0–10.5)

## 2013-01-02 LAB — URINE MICROSCOPIC-ADD ON

## 2013-01-02 LAB — PREGNANCY, URINE: Preg Test, Ur: NEGATIVE

## 2013-01-02 MED ORDER — SIMETHICONE 80 MG PO CHEW
80.0000 mg | CHEWABLE_TABLET | Freq: Once | ORAL | Status: AC
  Administered 2013-01-02: 80 mg via ORAL
  Filled 2013-01-02: qty 1

## 2013-01-02 MED ORDER — ONDANSETRON HCL 4 MG/2ML IJ SOLN: 4.0000 mg | Freq: Once | INTRAMUSCULAR | Status: AC

## 2013-01-02 MED ORDER — MORPHINE SULFATE 2 MG/ML IJ SOLN
2.0000 mg | Freq: Once | INTRAMUSCULAR | Status: AC
  Administered 2013-01-02: 2 mg via INTRAVENOUS
  Filled 2013-01-02: qty 1

## 2013-01-02 MED ORDER — ONDANSETRON HCL 4 MG/2ML IJ SOLN
INTRAMUSCULAR | Status: AC
  Administered 2013-01-02: 4 mg via INTRAVENOUS
  Filled 2013-01-02: qty 2

## 2013-01-02 MED ORDER — ONDANSETRON 4 MG PO TBDP: 4.0000 mg | ORAL_TABLET | Freq: Three times a day (TID) | ORAL | Status: DC | PRN

## 2013-01-02 MED ORDER — SODIUM CHLORIDE 0.9 % IV BOLUS (SEPSIS): 1000.0000 mL | Freq: Once | INTRAVENOUS | Status: DC

## 2013-01-02 NOTE — ED Provider Notes (Signed)
History     CSN: [id number]  Arrival date & time 01/02/13  0019   First MD Initiated Contact with Patient 01/02/13 0206      Chief Complaint  Patient presents with  . Abdominal Pain    (Consider location/radiation/quality/duration/timing/severity/associated sxs/prior treatment) HPI Diana Holmes is a 32 y.o. female who presents to the Emergency Department complaining of abdominal pain, fullness, nausea, dizziness that began this evening. She had taken her first dose of fluoxetine this morning. She has a h/o anxiety and was given this medicine to begin. This evening she was sitting and became dizzy, hot, nauseated and had two stools, one hard and one diarrhea.  She denies fever, chills.   Past Medical History  Diagnosis Date  . Bronchitis     No past surgical history on file.  No family history on file.  History  Substance Use Topics  . Smoking status: Never Smoker   . Smokeless tobacco: Not on file  . Alcohol Use: No    OB History   Grav Para Term Preterm Abortions TAB SAB Ect Mult Living                  Review of Systems  Constitutional: Negative for fever.       10 Systems reviewed and are negative for acute change except as noted in the HPI.  HENT: Negative for congestion.   Eyes: Negative for discharge and redness.  Respiratory: Negative for cough and shortness of breath.   Cardiovascular: Negative for chest pain.  Gastrointestinal: Positive for nausea and abdominal pain. Negative for vomiting.       2 stools  Musculoskeletal: Negative for back pain.  Skin: Negative for rash.  Neurological: Positive for light-headedness. Negative for syncope, numbness and headaches.  Psychiatric/Behavioral:       No behavior change.    Allergies  Review of patient's allergies indicates no known allergies.  Home Medications   Current Outpatient Rx  Name  Route  Sig  Dispense  Refill  . acetaminophen (TYLENOL) 160 MG/5ML suspension   Oral   Take 320 mg by mouth  as needed. For pain         . etonogestrel (IMPLANON) 68 MG IMPL implant   Subcutaneous   Inject 1 each into the skin once.         . sulfamethoxazole-trimethoprim (BACTRIM DS) 800-160 MG per tablet   Oral   Take 1 tablet by mouth 2 (two) times daily before a meal.           LMP 10/04/2012  Physical Exam  Nursing note and vitals reviewed. Constitutional: She is oriented to person, place, and time. She appears well-developed and well-nourished.  Awake, alert, nontoxic appearance.  HENT:  Head: Normocephalic and atraumatic.  Right Ear: External ear normal.  Left Ear: External ear normal.  Mouth/Throat: Oropharynx is clear and moist.  Eyes: EOM are normal. Pupils are equal, round, and reactive to light. Right eye exhibits no discharge. Left eye exhibits no discharge.  Neck: Normal range of motion. Neck supple.  Cardiovascular: Normal rate and intact distal pulses.   Pulmonary/Chest: Effort normal and breath sounds normal. She exhibits no tenderness.  Abdominal: Soft. Bowel sounds are normal. There is no tenderness. There is no rebound.  Genitourinary:  No cva tenderness  Musculoskeletal: She exhibits no tenderness.  Baseline ROM, no obvious new focal weakness.  Neurological: She is alert and oriented to person, place, and time.  Mental status and motor strength appears  baseline for patient and situation.  Skin: No rash noted.  Psychiatric: She has a normal mood and affect.    ED Course  Procedures (including critical care time) Results for orders placed during the hospital encounter of 01/02/13  URINALYSIS, ROUTINE W REFLEX MICROSCOPIC      Result Value Range   Color, Urine YELLOW  YELLOW   APPearance CLEAR  CLEAR   Specific Gravity, Urine 1.025  1.005 - 1.030   pH 6.0  5.0 - 8.0   Glucose, UA NEGATIVE  NEGATIVE mg/dL   Hgb urine dipstick SMALL (*) NEGATIVE   Bilirubin Urine NEGATIVE  NEGATIVE   Ketones, ur NEGATIVE  NEGATIVE mg/dL   Protein, ur NEGATIVE   NEGATIVE mg/dL   Urobilinogen, UA 0.2  0.0 - 1.0 mg/dL   Nitrite NEGATIVE  NEGATIVE   Leukocytes, UA NEGATIVE  NEGATIVE  CBC      Result Value Range   WBC 16.0 (*) 4.0 - 10.5 K/uL   RBC 4.64  3.87 - 5.11 MIL/uL   Hemoglobin 14.8  12.0 - 15.0 g/dL   HCT 16.1  09.6 - 04.5 %   MCV 91.4  78.0 - 100.0 fL   MCH 31.9  26.0 - 34.0 pg   MCHC 34.9  30.0 - 36.0 g/dL   RDW 40.9  81.1 - 91.4 %   Platelets 358  150 - 400 K/uL  BASIC METABOLIC PANEL      Result Value Range   Sodium 137  135 - 145 mEq/L   Potassium 3.6  3.5 - 5.1 mEq/L   Chloride 100  96 - 112 mEq/L   CO2 26  19 - 32 mEq/L   Glucose, Bld 110 (*) 70 - 99 mg/dL   BUN 14  6 - 23 mg/dL   Creatinine, Ser 7.82  0.50 - 1.10 mg/dL   Calcium 9.6  8.4 - 95.6 mg/dL   GFR calc non Af Amer >90  >90 mL/min   GFR calc Af Amer >90  >90 mL/min  URINE MICROSCOPIC-ADD ON      Result Value Range   Squamous Epithelial / LPF MANY (*) RARE   WBC, UA 0-2  <3 WBC/hpf   RBC / HPF 3-6  <3 RBC/hpf   Bacteria, UA MANY (*) RARE  PREGNANCY, URINE      Result Value Range   Preg Test, Ur NEGATIVE  NEGATIVE    Dg Abd Acute W/chest  01/02/2013  *RADIOLOGY REPORT*  Clinical Data: Abdominal pain  ACUTE ABDOMEN SERIES (ABDOMEN 2 VIEW & CHEST 1 VIEW)  Comparison: 05/24/2012 chest radiograph, 04/27/2012 lumbar spine radiographs  Findings: Lungs are predominately clear.  Cardiomediastinal contours are within normal range.  Relative paucity of small or large bowel gas.  Air noted within stomach.  No free intraperitoneal air identified.  Organ outlines normal where seen.  No acute osseous finding.  IMPRESSION: Relative paucity of bowel gas can be seen with fluid filled or decompressed loops.   Original Report Authenticated By: Jearld Lesch, M.D.     MDM  Patient with abdominal pain and diarrhea. She had felt dizziness, hot, nauseated. She was given IVF, zofran, simethicone and morphine with improvement. Plain films showed gas in the stomach. Discussed the use  of zofran for nausea and bland diet. Pt stable in ED with no significant deterioration in condition.The patient appears reasonably screened and/or stabilized for discharge and I doubt any other medical condition or other [facility] requiring further screening, evaluation, or treatment in the ED  at this time prior to discharge.  MDM Reviewed: nursing note and vitals Interpretation: labs and x-ray           Nicoletta Dress. Colon Branch, MD 01/02/13 [phone]

## 2013-01-02 NOTE — ED Notes (Signed)
Pt reported abdominal pain since this afternoon.  Mild nausea.  Denies problems with urination.

## 2013-09-01 ENCOUNTER — Emergency Department (HOSPITAL_COMMUNITY)
Admission: EM | Admit: 2013-09-01 | Discharge: 2013-09-01 | Disposition: A | Discharge status: Home / Self Care | Payer: Medicaid Other | Attending: Emergency Medicine | Admitting: Emergency Medicine

## 2013-09-01 ENCOUNTER — Emergency Department (HOSPITAL_COMMUNITY): Payer: Medicaid Other

## 2013-09-01 ENCOUNTER — Encounter (HOSPITAL_COMMUNITY): Payer: Self-pay | Admitting: Emergency Medicine

## 2013-09-01 DIAGNOSIS — Y9389 Activity, other specified: Secondary | ICD-10-CM | POA: Insufficient documentation

## 2013-09-01 DIAGNOSIS — S93401A Sprain of unspecified ligament of right ankle, initial encounter: Secondary | ICD-10-CM

## 2013-09-01 DIAGNOSIS — X500XXA Overexertion from strenuous movement or load, initial encounter: Secondary | ICD-10-CM | POA: Insufficient documentation

## 2013-09-01 DIAGNOSIS — S93409A Sprain of unspecified ligament of unspecified ankle, initial encounter: Secondary | ICD-10-CM | POA: Insufficient documentation

## 2013-09-01 DIAGNOSIS — F17200 Nicotine dependence, unspecified, uncomplicated: Secondary | ICD-10-CM | POA: Insufficient documentation

## 2013-09-01 DIAGNOSIS — Z79899 Other long term (current) drug therapy: Secondary | ICD-10-CM | POA: Insufficient documentation

## 2013-09-01 DIAGNOSIS — Z8709 Personal history of other diseases of the respiratory system: Secondary | ICD-10-CM | POA: Insufficient documentation

## 2013-09-01 DIAGNOSIS — Y929 Unspecified place or not applicable: Secondary | ICD-10-CM | POA: Insufficient documentation

## 2013-09-01 MED ORDER — NAPROXEN 500 MG PO TABS: 500.0000 mg | ORAL_TABLET | Freq: Two times a day (BID) | ORAL | Status: DC

## 2013-09-01 NOTE — ED Notes (Signed)
Pt reports twisting right ankle previously tonight.  Pt does have a brace in place.  Pt reporting some tingling sensation in foot.

## 2013-09-01 NOTE — ED Provider Notes (Signed)
CSN: [id number]     Arrival date & time 09/01/13  0042 History   First MD Initiated Contact with Patient 09/01/13 0123     Chief Complaint  Patient presents with  . Ankle Injury   (Consider location/radiation/quality/duration/timing/severity/associated sxs/prior Treatment) HPI Comments: 32 year old female, presents with a second ankle injury in the last 2 weeks. She was not evaluated after the first one 2 weeks ago, she has been ambulating without difficulty and had returned to baseline until this evening when she rolled her ankle on a step, fell to acute pain in her right ankle on the medial surface which has been persistent, worse with ambulation and not associated with numbness or tingling. She denies swelling or bruising  Patient is a 32 y.o. female presenting with lower extremity injury. The history is provided by the patient.  Ankle Injury    Past Medical History  Diagnosis Date  . Bronchitis    History reviewed. No pertinent past surgical history. History reviewed. No pertinent family history. History  Substance Use Topics  . Smoking status: Current Some Day Smoker  . Smokeless tobacco: Not on file  . Alcohol Use: No   OB History   Grav Para Term Preterm Abortions TAB SAB Ect Mult Living                 Review of Systems  Gastrointestinal: Negative for nausea and vomiting.  Musculoskeletal: Positive for joint swelling ( ankle). Negative for back pain and neck pain.  Neurological: Negative for weakness and numbness.    Allergies  Review of patient's allergies indicates no known allergies.  Home Medications   Current Outpatient Rx  Name  Route  Sig  Dispense  Refill  . cholecalciferol (VITAMIN D) 1000 UNITS tablet   Oral   Take 1,000 Units by mouth daily.         Marland Kitchen venlafaxine (EFFEXOR) 75 MG tablet   Oral   Take 75 mg by mouth 1 day or 1 dose.         Marland Kitchen acetaminophen (TYLENOL) 160 MG/5ML suspension   Oral   Take 320 mg by mouth as needed. For pain          . etonogestrel (IMPLANON) 68 MG IMPL implant   Subcutaneous   Inject 1 each into the skin once.         . naproxen (NAPROSYN) 500 MG tablet   Oral   Take 1 tablet (500 mg total) by mouth 2 (two) times daily with a meal.   30 tablet   0   . ondansetron (ZOFRAN ODT) 4 MG disintegrating tablet   Oral   Take 1 tablet (4 mg total) by mouth every 8 (eight) hours as needed for nausea.   20 tablet   0   . sulfamethoxazole-trimethoprim (BACTRIM DS) 800-160 MG per tablet   Oral   Take 1 tablet by mouth 2 (two) times daily before a meal.          BP 144/80  Pulse 107  Temp(Src) 97.8 F (36.6 C) (Oral)  Resp 18  Ht 5\' 6"  (1.676 m)  Wt 214 lb (97.07 kg)  BMI 34.56 kg/m2  SpO2 100%  LMP 08/01/2013 Physical Exam  Nursing note and vitals reviewed. Constitutional: She appears well-developed and well-nourished. No distress.  HENT:  Head: Normocephalic and atraumatic.  Eyes: Conjunctivae are normal. No scleral icterus.  Cardiovascular: Normal rate, regular rhythm and intact distal pulses.   Pulmonary/Chest: Effort normal and breath sounds normal.  Musculoskeletal: She exhibits tenderness ( Palpation over the medial malleolus, no crepitus, no swelling, no bruising, normal range of motion of the right ankle and knee). She exhibits no edema.  No pain over the proximal fibula  Neurological: She is alert.  Skin: Skin is warm and dry. No rash noted. She is not diaphoretic.    ED Course  Procedures (including critical care time) Labs Review Labs Reviewed - No data to display Imaging Review Dg Ankle Complete Right  09/01/2013   CLINICAL DATA:  Medial ankle pain status post stepping off the stair wrong.  EXAM: RIGHT ANKLE - COMPLETE 3+ VIEW  COMPARISON:  None.  FINDINGS: There is no evidence of fracture, dislocation, or joint effusion. There is no evidence of arthropathy or other focal bone abnormality. Soft tissues are unremarkable. Prominent posterior and plantar calcaneal  enthesophytes are noted.  IMPRESSION: No acute fracture or dislocation.   Electronically Signed   By: Rise Mu M.D.   On: 09/01/2013 02:31    EKG Interpretation   None       MDM   1. Ankle sprain, right, initial encounter    Patient has likely ankle sprain, no signs of fracture on exam, imaging pending, patient declines pain medications.  X-ray negative for fracture, patient already has her own brace. Rice therapy recommend  Meds given in ED:  Medications - No data to display  New Prescriptions   NAPROXEN (NAPROSYN) 500 MG TABLET    Take 1 tablet (500 mg total) by mouth 2 (two) times daily with a meal.        Vida Roller, MD 09/01/13 [phone]

## 2013-12-08 ENCOUNTER — Encounter (HOSPITAL_COMMUNITY): Payer: Self-pay | Admitting: Emergency Medicine

## 2013-12-08 ENCOUNTER — Emergency Department (HOSPITAL_COMMUNITY): Payer: Medicaid Other

## 2013-12-08 ENCOUNTER — Emergency Department (HOSPITAL_COMMUNITY)
Admission: EM | Admit: 2013-12-08 | Discharge: 2013-12-08 | Disposition: A | Discharge status: Home / Self Care | Payer: Medicaid Other | Attending: Emergency Medicine | Admitting: Emergency Medicine

## 2013-12-08 DIAGNOSIS — Z792 Long term (current) use of antibiotics: Secondary | ICD-10-CM | POA: Insufficient documentation

## 2013-12-08 DIAGNOSIS — Z87891 Personal history of nicotine dependence: Secondary | ICD-10-CM | POA: Insufficient documentation

## 2013-12-08 DIAGNOSIS — J069 Acute upper respiratory infection, unspecified: Secondary | ICD-10-CM | POA: Insufficient documentation

## 2013-12-08 MED ORDER — BENZONATATE 100 MG PO CAPS
200.0000 mg | ORAL_CAPSULE | Freq: Once | ORAL | Status: AC
  Administered 2013-12-08: 200 mg via ORAL
  Filled 2013-12-08: qty 2

## 2013-12-08 MED ORDER — BENZONATATE 100 MG PO CAPS: 200.0000 mg | ORAL_CAPSULE | Freq: Three times a day (TID) | ORAL | Status: DC | PRN

## 2013-12-08 MED ORDER — PROMETHAZINE-CODEINE 6.25-10 MG/5ML PO SYRP: 5.0000 mL | ORAL_SOLUTION | ORAL | Status: DC | PRN

## 2013-12-08 NOTE — ED Notes (Signed)
Prod cough with green sputum x 2 -3 days. Has been taking mucinex. Nad. Denies pain

## 2013-12-08 NOTE — Discharge Instructions (Signed)
Upper Respiratory Infection, Adult °An upper respiratory infection (URI) is also sometimes known as the common cold. The upper respiratory tract includes the nose, sinuses, throat, trachea, and bronchi. Bronchi are the airways leading to the lungs. Most people improve within 1 week, but symptoms can last up to 2 weeks. A residual cough may last even longer.  °CAUSES °Many different viruses can infect the tissues lining the upper respiratory tract. The tissues become irritated and inflamed and often become very moist. Mucus production is also common. A cold is contagious. You can easily spread the virus to others by oral contact. This includes kissing, sharing a glass, coughing, or sneezing. Touching your mouth or nose and then touching a surface, which is then touched by another person, can also spread the virus. °SYMPTOMS  °Symptoms typically develop 1 to 3 days after you come in contact with a cold virus. Symptoms vary from person to person. They may include: °· Runny nose. °· Sneezing. °· Nasal congestion. °· Sinus irritation. °· Sore throat. °· Loss of voice (laryngitis). °· Cough. °· Fatigue. °· Muscle aches. °· Loss of appetite. °· Headache. °· Low-grade fever. °DIAGNOSIS  °You might diagnose your own cold based on familiar symptoms, since most people get a cold 2 to 3 times a year. Your caregiver can confirm this based on your exam. Most importantly, your caregiver can check that your symptoms are not due to another disease such as strep throat, sinusitis, pneumonia, asthma, or epiglottitis. Blood tests, throat tests, and X-rays are not necessary to diagnose a common cold, but they may sometimes be helpful in excluding other more serious diseases. Your caregiver will decide if any further tests are required. °RISKS AND COMPLICATIONS  °You may be at risk for a more severe case of the common cold if you smoke cigarettes, have chronic heart disease (such as heart failure) or lung disease (such as asthma), or if  you have a weakened immune system. The very young and very old are also at risk for more serious infections. Bacterial sinusitis, middle ear infections, and bacterial pneumonia can complicate the common cold. The common cold can worsen asthma and chronic obstructive pulmonary disease (COPD). Sometimes, these complications can require emergency medical care and may be life-threatening. °PREVENTION  °The best way to protect against getting a cold is to practice good hygiene. Avoid oral or hand contact with people with cold symptoms. Wash your hands often if contact occurs. There is no clear evidence that vitamin C, vitamin E, echinacea, or exercise reduces the chance of developing a cold. However, it is always recommended to get plenty of rest and practice good nutrition. °TREATMENT  °Treatment is directed at relieving symptoms. There is no cure. Antibiotics are not effective, because the infection is caused by a virus, not by bacteria. Treatment may include: °· Increased fluid intake. Sports drinks offer valuable electrolytes, sugars, and fluids. °· Breathing heated mist or steam (vaporizer or shower). °· Eating chicken soup or other clear broths, and maintaining good nutrition. °· Getting plenty of rest. °· Using gargles or lozenges for comfort. °· Controlling fevers with ibuprofen or acetaminophen as directed by your caregiver. °· Increasing usage of your inhaler if you have asthma. °Zinc gel and zinc lozenges, taken in the first 24 hours of the common cold, can shorten the duration and lessen the severity of symptoms. Pain medicines may help with fever, muscle aches, and throat pain. A variety of non-prescription medicines are available to treat congestion and runny nose. Your caregiver   can make recommendations and may suggest nasal or lung inhalers for other symptoms.  HOME CARE INSTRUCTIONS   Only take over-the-counter or prescription medicines for pain, discomfort, or fever as directed by your  caregiver.  Use a warm mist humidifier or inhale steam from a shower to increase air moisture. This may keep secretions moist and make it easier to breathe.  Drink enough water and fluids to keep your urine clear or pale yellow.  Rest as needed.  Return to work when your temperature has returned to normal or as your caregiver advises. You may need to stay home longer to avoid infecting others. You can also use a face mask and careful hand washing to prevent spread of the virus. SEEK MEDICAL CARE IF:   After the first few days, you feel you are getting worse rather than better.  You need your caregiver's advice about medicines to control symptoms.  You develop chills, worsening shortness of breath, or brown or red sputum. These may be signs of pneumonia.  You develop yellow or brown nasal discharge or pain in the face, especially when you bend forward. These may be signs of sinusitis.  You develop a fever, swollen neck glands, pain with swallowing, or white areas in the back of your throat. These may be signs of strep throat. SEEK IMMEDIATE MEDICAL CARE IF:   You have a fever.  You develop severe or persistent headache, ear pain, sinus pain, or chest pain.  You develop wheezing, a prolonged cough, cough up blood, or have a change in your usual mucus (if you have chronic lung disease).  You develop sore muscles or a stiff neck. Document Released: 04/05/2001 Document Revised: 01/02/2012 Document Reviewed: 02/11/2011 [facility] CenterExit[facility] Patient Information 2014 [place], [place].   You may continue to use the Tessalon medication as prescribed.  I've also prescribed a narcotic cough syrup which can help relieve coughing but also pain associated with coughing.  This medication will make you drowsy do not take this medication within 4 hours of driving or any other potentially dangerous activity.  Rest and make sure you're drinking plenty of fluids.  You may continue using your Mucinex for your other  symptoms.

## 2013-12-09 NOTE — ED Provider Notes (Signed)
Medical screening examination/treatment/procedure(s) were performed by non-physician practitioner and as supervising physician I was immediately available for consultation/collaboration.  EKG Interpretation   None         Benny LennertJoseph L Pascal Stiggers, MD 12/09/13 [phone]

## 2013-12-09 NOTE — ED Provider Notes (Signed)
CSN: [id number]     Arrival date & time 12/08/13  1231 History   First MD Initiated Contact with Patient 12/08/13 1234     Chief Complaint  Patient presents with  . Cough     (Consider location/radiation/quality/duration/timing/severity/associated sxs/prior Treatment) Patient is a 33 y.o. female presenting with cough. The history is provided by the patient.  Cough Cough characteristics:  Productive Sputum characteristics:  Green Severity:  Moderate Onset quality:  Gradual Duration:  3 days Timing:  Intermittent Progression:  Worsening Chronicity:  New Smoker: no   Context: upper respiratory infection   Relieved by:  Nothing Worsened by:  Nothing tried Ineffective treatments: Mucinex cold and sinus formula. Associated symptoms: rhinorrhea, sinus congestion and sore throat   Associated symptoms: no chest pain, no chills, no ear fullness, no ear pain, no eye discharge, no fever, no headaches, no shortness of breath and no wheezing     Past Medical History  Diagnosis Date  . Bronchitis    Past Surgical History  Procedure Laterality Date  . Tubal ligation     History reviewed. No pertinent family history. History  Substance Use Topics  . Smoking status: Former Games developermoker  . Smokeless tobacco: Former NeurosurgeonUser    Quit date: 04/07/2013  . Alcohol Use: No   OB History   Grav Para Term Preterm Abortions TAB SAB Ect Mult Living                 Review of Systems  Constitutional: Negative for fever and chills.  HENT: Positive for rhinorrhea and sore throat. Negative for ear pain.   Eyes: Negative for discharge.  Respiratory: Positive for cough. Negative for shortness of breath and wheezing.   Cardiovascular: Negative for chest pain.  Neurological: Negative for headaches.      Allergies  Review of patient's allergies indicates no known allergies.  Home Medications   Current Outpatient Rx  Name  Route  Sig  Dispense  Refill  . Phenylephrine-APAP-Guaifenesin (MUCINEX  FAST-MAX COLD & SINUS) 10-650-400 MG/20ML LIQD   Oral   Take 20 mLs by mouth every 4 (four) hours as needed (cough, cold and congestion).         Marland Kitchen. sulfamethoxazole-trimethoprim (BACTRIM DS) 800-160 MG per tablet   Oral   Take 1 tablet by mouth 2 (two) times daily before a meal.         . benzonatate (TESSALON) 100 MG capsule   Oral   Take 2 capsules (200 mg total) by mouth 3 (three) times daily as needed for cough.   30 capsule   0   . promethazine-codeine (PHENERGAN WITH CODEINE) 6.25-10 MG/5ML syrup   Oral   Take 5 mLs by mouth every 4 (four) hours as needed for cough.   100 mL   0    BP 136/81  Pulse 109  Temp(Src) 98.8 F (37.1 C)  Resp 20  SpO2 93%  LMP 11/07/2013 Physical Exam  Constitutional: She is oriented to person, place, and time. She appears well-developed and well-nourished.  HENT:  Head: Normocephalic and atraumatic.  Right Ear: Tympanic membrane and ear canal normal.  Left Ear: Tympanic membrane and ear canal normal.  Nose: Mucosal edema and rhinorrhea present.  Mouth/Throat: Uvula is midline, oropharynx is clear and moist and mucous membranes are normal. No oropharyngeal exudate, posterior oropharyngeal edema, posterior oropharyngeal erythema or tonsillar abscesses.  Eyes: Conjunctivae are normal.  Cardiovascular: Normal rate and normal heart sounds.   Pulmonary/Chest: Effort normal. No respiratory distress. She has  no wheezes. She has rhonchi in the right lower field and the left lower field. She has no rales.  Bilateral rhonchi that clears with cough  Abdominal: Soft. There is no tenderness.  Musculoskeletal: Normal range of motion.  Neurological: She is alert and oriented to person, place, and time.  Skin: Skin is warm and dry. No rash noted.  Psychiatric: She has a normal mood and affect.    ED Course  Procedures (including critical care time) Labs Review Labs Reviewed - No data to display Imaging Review Dg Chest 2 View  12/08/2013    CLINICAL DATA:  Cough.  EXAM: CHEST  2 VIEW  COMPARISON:  May 24, 2012.  FINDINGS: The heart size and mediastinal contours are within normal limits. Both lungs are clear. No pleural effusion or pneumothorax is noted. The visualized skeletal structures are unremarkable.  IMPRESSION: No active cardiopulmonary disease.   Electronically Signed   By: Roque Lias M.D.   On: 12/08/2013 14:04    EKG Interpretation   None       MDM   Final diagnoses:  Acute URI    X-rays reviewed prior to discharge home.  Discussed with patient.  Exam is consistent with URI/bronchitis without wheezing.  She was prescribed Tessalon Perles and also Phenergan with codeine, cautioned regarding sedation properties of the Phenergan With Codeine syrup.  Encouraged rest, increase fluid intake, recheck by PCP if not improving with this treatment, or for worsen symptoms, particularly shortness of breath, weakness or high fevers.    Burgess Amor, PA-C 12/09/13 1849

## 2014-01-31 ENCOUNTER — Encounter (HOSPITAL_COMMUNITY): Payer: Self-pay | Admitting: Emergency Medicine

## 2014-01-31 ENCOUNTER — Emergency Department (HOSPITAL_COMMUNITY)
Admission: EM | Admit: 2014-01-31 | Discharge: 2014-01-31 | Disposition: A | Discharge status: Home / Self Care | Payer: Medicaid Other | Attending: Emergency Medicine | Admitting: Emergency Medicine

## 2014-01-31 DIAGNOSIS — Z8709 Personal history of other diseases of the respiratory system: Secondary | ICD-10-CM | POA: Insufficient documentation

## 2014-01-31 DIAGNOSIS — Z87891 Personal history of nicotine dependence: Secondary | ICD-10-CM | POA: Insufficient documentation

## 2014-01-31 DIAGNOSIS — H6690 Otitis media, unspecified, unspecified ear: Secondary | ICD-10-CM | POA: Insufficient documentation

## 2014-01-31 DIAGNOSIS — H6691 Otitis media, unspecified, right ear: Secondary | ICD-10-CM

## 2014-01-31 MED ORDER — IBUPROFEN 600 MG PO TABS: 600.0000 mg | ORAL_TABLET | Freq: Three times a day (TID) | ORAL | Status: DC | PRN

## 2014-01-31 MED ORDER — AMOXICILLIN 500 MG PO CAPS: 500.0000 mg | ORAL_CAPSULE | Freq: Three times a day (TID) | ORAL | Status: DC

## 2014-01-31 MED ORDER — IBUPROFEN 400 MG PO TABS
600.0000 mg | ORAL_TABLET | Freq: Once | ORAL | Status: AC
  Administered 2014-01-31: 600 mg via ORAL
  Filled 2014-01-31: qty 2

## 2014-01-31 MED ORDER — ANTIPYRINE-BENZOCAINE 5.4-1.4 % OT SOLN
3.0000 [drp] | Freq: Once | OTIC | Status: AC
  Administered 2014-01-31: 3 [drp] via OTIC
  Filled 2014-01-31: qty 10

## 2014-01-31 MED ORDER — AMOXICILLIN 250 MG PO CAPS
500.0000 mg | ORAL_CAPSULE | Freq: Once | ORAL | Status: AC
  Administered 2014-01-31: 500 mg via ORAL
  Filled 2014-01-31: qty 2

## 2014-01-31 NOTE — ED Notes (Signed)
Patient c/o right ear pain.

## 2014-01-31 NOTE — ED Provider Notes (Signed)
CSN: [id number]     Arrival date & time 01/31/14  0017 History   This chart was scribed for Lyanne CoKevin M Jazel Nimmons, MD by Bennett Scrapehristina Taylor, ED Scribe. This patient was seen in room [facility]06[facility] and the patient's care was started at 12:49 AM.     Chief Complaint  Patient presents with  . Otalgia    The history is provided by the patient. No language interpreter was used.    HPI Comments: Diana Holmes is a 33 y.o. female who presents to the Emergency Department complaining of gradual onset, gradually worsening right otalgia that started around 2 PM today. She reports that she has been sick within the past few days with thick, green nasal discharge and states that she will occasionally develop ear infections with other viral symptoms. She denies any recent swimming or injuries to the ear. She denies any drainage, fevers, chills or cough as associated symptoms.   Past Medical History  Diagnosis Date  . Bronchitis    Past Surgical History  Procedure Laterality Date  . Tubal ligation     No family history on file. History  Substance Use Topics  . Smoking status: Former Games developermoker  . Smokeless tobacco: Former NeurosurgeonUser    Quit date: 04/07/2013  . Alcohol Use: No   No OB history provided.  Review of Systems  A complete 10 system review of systems was obtained and all systems are negative except as noted in the HPI and PMH.   Allergies  Review of patient's allergies indicates no known allergies.  Home Medications   Current Outpatient Rx  Name  Route  Sig  Dispense  Refill  . amoxicillin (AMOXIL) 500 MG capsule   Oral   Take 1 capsule (500 mg total) by mouth 3 (three) times daily.   15 capsule   0   . benzonatate (TESSALON) 100 MG capsule   Oral   Take 2 capsules (200 mg total) by mouth 3 (three) times daily as needed for cough.   30 capsule   0   . ibuprofen (ADVIL,MOTRIN) 600 MG tablet   Oral   Take 1 tablet (600 mg total) by mouth every 8 (eight) hours as needed.   15 tablet    0   . Phenylephrine-APAP-Guaifenesin (MUCINEX FAST-MAX COLD & SINUS) 10-650-400 MG/20ML LIQD   Oral   Take 20 mLs by mouth every 4 (four) hours as needed (cough, cold and congestion).         . promethazine-codeine (PHENERGAN WITH CODEINE) 6.25-10 MG/5ML syrup   Oral   Take 5 mLs by mouth every 4 (four) hours as needed for cough.   100 mL   0   . sulfamethoxazole-trimethoprim (BACTRIM DS) 800-160 MG per tablet   Oral   Take 1 tablet by mouth 2 (two) times daily before a meal.          Triage Vitals: BP 122/75  Pulse 81  Temp(Src) 98.8 F (37.1 C) (Oral)  Resp 18  Ht 5\' 4"  (1.626 m)  Wt 198 lb (89.812 kg)  BMI 33.97 kg/m2  SpO2 99%  LMP 12/26/2013  Physical Exam  Nursing note and vitals reviewed. Constitutional: She is oriented to person, place, and time. She appears well-developed and well-nourished.  HENT:  Head: Normocephalic and atraumatic.  Mild erythema and bulging of the right TM  Eyes: EOM are normal.  Neck: Normal range of motion.  Musculoskeletal: Normal range of motion.  Lymphadenopathy:    She has no cervical adenopathy.  Neurological: She is alert and oriented to person, place, and time.  Skin: Skin is warm and dry.  Psychiatric: She has a normal mood and affect. Her behavior is normal.    ED Course  Procedures (including critical care time)  DIAGNOSTIC STUDIES: Oxygen Saturation is 99% on RA, normal by my interpretation.    COORDINATION OF CARE: 12:50 AM-Informed pt that her symptoms are from an ear infection. Discussed discharge plan which includes antibiotics and ear drop pain medication with pt and pt agreed to plan. Also advised pt to follow up as needed and pt agreed. Addressed symptoms to return for with pt.   Labs Review Labs Reviewed - No data to display Imaging Review No results found.   EKG Interpretation None      MDM   Final diagnoses:  Otitis media, right    I personally performed the services described in this  documentation, which was scribed in my presence. The recorded information has been reviewed and is accurate.        Lyanne Co, MD 01/31/14 [phone]

## 2014-01-31 NOTE — Discharge Instructions (Signed)
Otitis Media, Adult Otitis media is redness, soreness, and swelling (inflammation) of the middle ear. Otitis media may be caused by allergies or, most commonly, by infection. Often it occurs as a complication of the common cold. SIGNS AND SYMPTOMS Symptoms of otitis media may include:  Earache.  Fever.  Ringing in your ear.  Headache.  Leakage of fluid from the ear. DIAGNOSIS To diagnose otitis media, your health care provider will examine your ear with an otoscope. This is an instrument that allows your health care provider to see into your ear in order to examine your eardrum. Your health care provider also will ask you questions about your symptoms. TREATMENT  Typically, otitis media resolves on its own within 3 5 days. Your health care provider may prescribe medicine to ease your symptoms of pain. If otitis media does not resolve within 5 days or is recurrent, your health care provider may prescribe antibiotic medicines if he or she suspects that a bacterial infection is the cause. HOME CARE INSTRUCTIONS   Take your medicine as directed until it is gone, even if you feel better after the first few days.  Only take over-the-counter or prescription medicines for pain, discomfort, or fever as directed by your health care provider.  Follow up with your health care provider as directed. SEEK MEDICAL CARE IF:  You have otitis media only in one ear or bleeding from your nose or both.  You notice a lump on your neck.  You are not getting better in 3 5 days.  You feel worse instead of better. SEEK IMMEDIATE MEDICAL CARE IF:   You have pain that is not controlled with medicine.  You have swelling, redness, or pain around your ear or stiffness in your neck.  You notice that part of your face is paralyzed.  You notice that the bone behind your ear (mastoid) is tender when you touch it. MAKE SURE YOU:   Understand these instructions.  Will watch your condition.  Will get help  right away if you are not doing well or get worse. Document Released: 07/15/2004 Document Revised: 07/31/2013 Document Reviewed: 05/07/2013 [employer] Patient Information 2014 [employer].  

## 2014-02-27 ENCOUNTER — Encounter (HOSPITAL_COMMUNITY): Payer: Self-pay | Admitting: Emergency Medicine

## 2014-02-27 ENCOUNTER — Emergency Department (HOSPITAL_COMMUNITY)
Admission: EM | Admit: 2014-02-27 | Discharge: 2014-02-27 | Disposition: A | Discharge status: Home / Self Care | Payer: Medicaid Other | Attending: Emergency Medicine | Admitting: Emergency Medicine

## 2014-02-27 DIAGNOSIS — Z9851 Tubal ligation status: Secondary | ICD-10-CM | POA: Insufficient documentation

## 2014-02-27 DIAGNOSIS — Z8709 Personal history of other diseases of the respiratory system: Secondary | ICD-10-CM | POA: Insufficient documentation

## 2014-02-27 DIAGNOSIS — Z792 Long term (current) use of antibiotics: Secondary | ICD-10-CM | POA: Insufficient documentation

## 2014-02-27 DIAGNOSIS — Z3202 Encounter for pregnancy test, result negative: Secondary | ICD-10-CM | POA: Insufficient documentation

## 2014-02-27 DIAGNOSIS — Z87891 Personal history of nicotine dependence: Secondary | ICD-10-CM | POA: Insufficient documentation

## 2014-02-27 DIAGNOSIS — N390 Urinary tract infection, site not specified: Secondary | ICD-10-CM | POA: Insufficient documentation

## 2014-02-27 DIAGNOSIS — Z79899 Other long term (current) drug therapy: Secondary | ICD-10-CM | POA: Insufficient documentation

## 2014-02-27 LAB — HIV ANTIBODY (ROUTINE TESTING W REFLEX): HIV 1&2 Ab, 4th Generation: NONREACTIVE

## 2014-02-27 LAB — URINE MICROSCOPIC-ADD ON

## 2014-02-27 LAB — URINALYSIS, ROUTINE W REFLEX MICROSCOPIC
Bilirubin Urine: NEGATIVE
GLUCOSE, UA: NEGATIVE mg/dL
Ketones, ur: NEGATIVE mg/dL
Leukocytes, UA: NEGATIVE
Nitrite: NEGATIVE
PH: 6 (ref 5.0–8.0)
PROTEIN: NEGATIVE mg/dL
Specific Gravity, Urine: 1.025 (ref 1.005–1.030)
Urobilinogen, UA: 0.2 mg/dL (ref 0.0–1.0)

## 2014-02-27 LAB — PREGNANCY, URINE: PREG TEST UR: NEGATIVE

## 2014-02-27 LAB — RPR

## 2014-02-27 LAB — WET PREP, GENITAL
TRICH WET PREP: NONE SEEN
Yeast Wet Prep HPF POC: NONE SEEN

## 2014-02-27 MED ORDER — CEPHALEXIN 500 MG PO CAPS: 500.0000 mg | ORAL_CAPSULE | Freq: Four times a day (QID) | ORAL | Status: DC

## 2014-02-27 MED ORDER — PHENAZOPYRIDINE HCL 200 MG PO TABS: 200.0000 mg | ORAL_TABLET | Freq: Three times a day (TID) | ORAL | Status: DC

## 2014-02-27 MED ORDER — CEPHALEXIN 500 MG PO CAPS
1000.0000 mg | ORAL_CAPSULE | Freq: Once | ORAL | Status: AC
  Administered 2014-02-27: 1000 mg via ORAL
  Filled 2014-02-27: qty 2

## 2014-02-27 NOTE — ED Notes (Signed)
Pain in lower back and burning with urination, also c/o vaginal itching.

## 2014-02-27 NOTE — Discharge Instructions (Signed)
Urinary Tract Infection Urinary tract infections (UTIs) can develop anywhere along your urinary tract. Your urinary tract is your body's drainage system for removing wastes and extra water. Your urinary tract includes two kidneys, two ureters, a bladder, and a urethra. Your kidneys are a pair of bean-shaped organs. Each kidney is about the size of your fist. They are located below your ribs, one on each side of your spine. CAUSES Infections are caused by microbes, which are microscopic organisms, including fungi, viruses, and bacteria. These organisms are so small that they can only be seen through a microscope. Bacteria are the microbes that most commonly cause UTIs. SYMPTOMS  Symptoms of UTIs may vary by age and gender of the patient and by the location of the infection. Symptoms in young women typically include a frequent and intense urge to urinate and a painful, burning feeling in the bladder or urethra during urination. Older women and men are more likely to be tired, shaky, and weak and have muscle aches and abdominal pain. A fever may mean the infection is in your kidneys. Other symptoms of a kidney infection include pain in your back or sides below the ribs, nausea, and vomiting. DIAGNOSIS To diagnose a UTI, your caregiver will ask you about your symptoms. Your caregiver also will ask to provide a urine sample. The urine sample will be tested for bacteria and white blood cells. White blood cells are made by your body to help fight infection. TREATMENT  Typically, UTIs can be treated with medication. Because most UTIs are caused by a bacterial infection, they usually can be treated with the use of antibiotics. The choice of antibiotic and length of treatment depend on your symptoms and the type of bacteria causing your infection. HOME CARE INSTRUCTIONS  If you were prescribed antibiotics, take them exactly as your caregiver instructs you. Finish the medication even if you feel better after you  have only taken some of the medication.  Drink enough water and fluids to keep your urine clear or pale yellow.  Avoid caffeine, tea, and carbonated beverages. They tend to irritate your bladder.  Empty your bladder often. Avoid holding urine for long periods of time.  Empty your bladder before and after sexual intercourse.  After a bowel movement, women should cleanse from front to back. Use each tissue only once. SEEK MEDICAL CARE IF:   You have back pain.  You develop a fever.  Your symptoms do not begin to resolve within 3 days. SEEK IMMEDIATE MEDICAL CARE IF:   You have severe back pain or lower abdominal pain.  You develop chills.  You have nausea or vomiting.  You have continued burning or discomfort with urination. MAKE SURE YOU:   Understand these instructions.  Will watch your condition.  Will get help right away if you are not doing well or get worse. Document Released: 07/20/2005 Document Revised: 04/10/2012 Document Reviewed: 11/18/2011 [facility] Patient Information 2014 [employer].  Cephalexin tablets or capsules What is this medicine? CEPHALEXIN (sef a LEX in) is a cephalosporin antibiotic. It is used to treat certain kinds of bacterial infections It will not work for colds, flu, or other viral infections. This medicine may be used for other purposes; ask your health care provider or pharmacist if you have questions. COMMON BRAND NAME(S): Biocef, Keflex, Keftab What should I tell my health care provider before I take this medicine? They need to know if you have any of these conditions: -kidney disease -stomach or intestine problems, especially colitis -  an unusual or allergic reaction to cephalexin, other cephalosporins, penicillins, other antibiotics, medicines, foods, dyes or preservatives -pregnant or trying to get pregnant -breast-feeding How should I use this medicine? Take this medicine by mouth with a full glass of water. Follow the  directions on the prescription label. This medicine can be taken with or without food. Take your medicine at regular intervals. Do not take your medicine more often than directed. Take all of your medicine as directed even if you think you are better. Do not skip doses or stop your medicine early. Talk to your pediatrician regarding the use of this medicine in children. While this drug may be prescribed for selected conditions, precautions do apply. Overdosage: If you think you have taken too much of this medicine contact a poison control center or emergency room at once. NOTE: This medicine is only for you. Do not share this medicine with others. What if I miss a dose? If you miss a dose, take it as soon as you can. If it is almost time for your next dose, take only that dose. Do not take double or extra doses. There should be at least 4 to 6 hours between doses. What may interact with this medicine? -probenecid -some other antibiotics This list may not describe all possible interactions. Give your health care provider a list of all the medicines, herbs, non-prescription drugs, or dietary supplements you use. Also tell them if you smoke, drink alcohol, or use illegal drugs. Some items may interact with your medicine. What should I watch for while using this medicine? Tell your doctor or health care professional if your symptoms do not begin to improve in a few days. Do not treat diarrhea with over the counter products. Contact your doctor if you have diarrhea that lasts more than 2 days or if it is severe and watery. If you have diabetes, you may get a false-positive result for sugar in your urine. Check with your doctor or health care professional. What side effects may I notice from receiving this medicine? Side effects that you should report to your doctor or health care professional as soon as possible: -allergic reactions like skin rash, itching or hives, swelling of the face, lips, or  tongue -breathing problems -pain or trouble passing urine -redness, blistering, peeling or loosening of the skin, including inside the mouth -severe or watery diarrhea -unusually weak or tired -yellowing of the eyes, skin Side effects that usually do not require medical attention (report to your doctor or health care professional if they continue or are bothersome): -gas or heartburn -genital or anal irritation -headache -joint or muscle pain -nausea, vomiting This list may not describe all possible side effects. Call your doctor for medical advice about side effects. You may report side effects to FDA at 1-800-FDA-1088. Where should I keep my medicine? Keep out of the reach of children. Store at room temperature between 59 and 86 degrees F (15 and 30 degrees C). Throw away any unused medicine after the expiration date. NOTE: This sheet is a summary. It may not cover all possible information. If you have questions about this medicine, talk to your doctor, pharmacist, or health care provider.  2014, [employer]. (2008-01-14 17:09:13)  Phenazopyridine tablets What is this medicine? PHENAZOPYRIDINE (fen az oh PEER i deen) is a pain reliever. It is used to stop the pain, burning, or discomfort caused by infection or irritation of the urinary tract. This medicine is not an antibiotic. It will not cure a  urinary tract infection. This medicine may be used for other purposes; ask your health care provider or pharmacist if you have questions. COMMON BRAND NAME(S): AZO, Azo-100, Azo-Gesic, Azo-Septic, Azo-Standard, Phenazo, Prodium, Pyridium, Urinary Analgesic , Uristat What should I tell my health care provider before I take this medicine? They need to know if you have any of these conditions: -glucose-6-phosphate dehydrogenase (G6PD) deficiency -kidney disease -an unusual or allergic reaction to phenazopyridine, other medicines, foods, dyes, or preservatives -pregnant or trying to  get pregnant -breast-feeding How should I use this medicine? Take this medicine by mouth with a glass of water. Follow the directions on the prescription label. Take after meals. Take your doses at regular intervals. Do not take your medicine more often than directed. Do not skip doses or stop your medicine early even if you feel better. Do not stop taking except on your doctor's advice. Talk to your pediatrician regarding the use of this medicine in children. Special care may be needed. Overdosage: If you think you have taken too much of this medicine contact a poison control center or emergency room at once. NOTE: This medicine is only for you. Do not share this medicine with others. What if I miss a dose? If you miss a dose, take it as soon as you can. If it is almost time for your next dose, take only that dose. Do not take double or extra doses. What may interact with this medicine? Interactions are not expected. This list may not describe all possible interactions. Give your health care provider a list of all the medicines, herbs, non-prescription drugs, or dietary supplements you use. Also tell them if you smoke, drink alcohol, or use illegal drugs. Some items may interact with your medicine. What should I watch for while using this medicine? Tell your doctor or health care professional if your symptoms do not improve or if they get worse. This medicine colors body fluids red. This effect is harmless and will go away after you are done taking the medicine. It will change urine to an dark orange or red color. The red color may stain clothing. Soft contact lenses may become permanently stained. It is best not to wear soft contact lenses while taking this medicine. If you are diabetic you may get a false positive result for sugar in your urine. Talk to your health care provider. What side effects may I notice from receiving this medicine? Side effects that you should report to your doctor or  health care professional as soon as possible: -allergic reactions like skin rash, itching or hives, swelling of the face, lips, or tongue -blue or purple color of the skin -difficulty breathing -fever -less urine -unusual bleeding, bruising -unusual tired, weak -vomiting -yellowing of the eyes or skin Side effects that usually do not require medical attention (report to your doctor or health care professional if they continue or are bothersome): -dark urine -headache -stomach upset This list may not describe all possible side effects. Call your doctor for medical advice about side effects. You may report side effects to FDA at 1-800-FDA-1088. Where should I keep my medicine? Keep out of the reach of children. Store at room temperature between 15 and 30 degrees C (59 and 86 degrees F). Protect from light and moisture. Throw away any unused medicine after the expiration date. NOTE: This sheet is a summary. It may not cover all possible information. If you have questions about this medicine, talk to your doctor, pharmacist, or health care provider.  2014, [employer]/Gold Standard. (2008-05-08 11:04:07)

## 2014-02-27 NOTE — ED Provider Notes (Signed)
CSN: [id number]     Arrival date & time 02/27/14  0042 History   First MD Initiated Contact with Patient 02/27/14 0143     Chief Complaint  Patient presents with  . Dysuria     (Consider location/radiation/quality/duration/timing/severity/associated sxs/prior Treatment) Patient is a 33 y.o. female presenting with dysuria. The history is provided by the patient.  Dysuria She has noted burning when she urinates for the last 3 days. Mrs. getting worse. There is also been some vaginal burning and itching which is worse when she urinates but also present when she's not urinating. There's been a very slight discharge which is white. There is some pain which radiates up into both flank areas and she rates his pain at 6/10. There's been no fever, chills, sweats. There's been slight urinary frequency but no urgency or tenesmus. There's been no nausea or vomiting. She has not done anything to try and treat her symptoms. She has had urinary tract infections in the past but only when pregnant.  Past Medical History  Diagnosis Date  . Bronchitis    Past Surgical History  Procedure Laterality Date  . Tubal ligation     No family history on file. History  Substance Use Topics  . Smoking status: Former Games developermoker  . Smokeless tobacco: Former NeurosurgeonUser    Quit date: 04/07/2013  . Alcohol Use: No   OB History   Grav Para Term Preterm Abortions TAB SAB Ect Mult Living                 Review of Systems  Genitourinary: Positive for dysuria.  All other systems reviewed and are negative.     Allergies  Review of patient's allergies indicates no known allergies.  Home Medications   Prior to Admission medications   Medication Sig Start Date End Date Taking? Authorizing Provider  ibuprofen (ADVIL,MOTRIN) 600 MG tablet Take 1 tablet (600 mg total) by mouth every 8 (eight) hours as needed. 01/31/14  Yes Lyanne CoKevin M Campos, MD  amoxicillin (AMOXIL) 500 MG capsule Take 1 capsule (500 mg total) by mouth 3  (three) times daily. 01/31/14   Lyanne CoKevin M Campos, MD  benzonatate (TESSALON) 100 MG capsule Take 2 capsules (200 mg total) by mouth 3 (three) times daily as needed for cough. 12/08/13   Burgess AmorJulie Idol, PA-C  Phenylephrine-APAP-Guaifenesin (MUCINEX FAST-MAX COLD & SINUS) 10-650-400 MG/20ML LIQD Take 20 mLs by mouth every 4 (four) hours as needed (cough, cold and congestion).    Historical Provider, MD  promethazine-codeine (PHENERGAN WITH CODEINE) 6.25-10 MG/5ML syrup Take 5 mLs by mouth every 4 (four) hours as needed for cough. 12/08/13   Burgess AmorJulie Idol, PA-C  sulfamethoxazole-trimethoprim (BACTRIM DS) 800-160 MG per tablet Take 1 tablet by mouth 2 (two) times daily before a meal. 05/22/12   Historical Provider, MD   BP 120/72  Pulse 97  Temp(Src) 98.1 F (36.7 C) (Oral)  Resp 18  Ht 5\' 5"  (1.651 m)  Wt 195 lb (88.451 kg)  BMI 32.45 kg/m2  SpO2 100%  LMP 02/05/2014 Physical Exam  Nursing note and vitals reviewed.   33 year old female, resting comfortably and in no acute distress. Vital signs are normal. Oxygen saturation is 100%, which is normal. Head is normocephalic and atraumatic. PERRLA, EOMI. Oropharynx is clear. Neck is nontender and supple without adenopathy or JVD. Back is nontender in the midline. There is mild left CVA tenderness with no right CVA tenderness. Lungs are clear without rales, wheezes, or rhonchi. Chest is nontender. Heart  has regular rate and rhythm without murmur. Abdomen is soft, flat, nontender without masses or hepatosplenomegaly and peristalsis is normoactive. Pelvic: Normal external female genitalia. Small moderate amount of yellowish to greenish discharge present. Mild erythema of the cervix. Fundus normal size and position and nontender. No adnexal masses or tenderness. Extremities have no cyanosis or edema, full range of motion is present. Skin is warm and dry without rash. Neurologic: Mental status is normal, cranial nerves are intact, there are no motor or sensory  deficits.  ED Course  Procedures (including critical care time) Labs Review Results for orders placed during the hospital encounter of 02/27/14  WET PREP, GENITAL      Result Value Ref Range   Yeast Wet Prep HPF POC NONE SEEN  NONE SEEN   Trich, Wet Prep NONE SEEN  NONE SEEN   Clue Cells Wet Prep HPF POC FEW (*) NONE SEEN   WBC, Wet Prep HPF POC TOO NUMEROUS TO COUNT (*) NONE SEEN  URINALYSIS, ROUTINE W REFLEX MICROSCOPIC      Result Value Ref Range   Color, Urine YELLOW  YELLOW   APPearance CLEAR  CLEAR   Specific Gravity, Urine 1.025  1.005 - 1.030   pH 6.0  5.0 - 8.0   Glucose, UA NEGATIVE  NEGATIVE mg/dL   Hgb urine dipstick SMALL (*) NEGATIVE   Bilirubin Urine NEGATIVE  NEGATIVE   Ketones, ur NEGATIVE  NEGATIVE mg/dL   Protein, ur NEGATIVE  NEGATIVE mg/dL   Urobilinogen, UA 0.2  0.0 - 1.0 mg/dL   Nitrite NEGATIVE  NEGATIVE   Leukocytes, UA NEGATIVE  NEGATIVE  PREGNANCY, URINE      Result Value Ref Range   Preg Test, Ur NEGATIVE  NEGATIVE  URINE MICROSCOPIC-ADD ON      Result Value Ref Range   Squamous Epithelial / LPF FEW (*) RARE   WBC, UA 0-2  <3 WBC/hpf   RBC / HPF 0-2  <3 RBC/hpf   Bacteria, UA MANY (*) RARE    MDM   Final diagnoses:  Urinary tract infection    Vaginal pain and itching with dysuria. Urinalysis has many bacteria but no pyuria. I suspect that the primary problem is vaginitis and not UTI. Pelvic exam suggests vaginitis. Old records are reviewed and she was on a course of amoxicillin last month and that would put her at risk for yeast infection.  Wet prep was significant only for presence of many WBCs no clue cells or at yeast. Therefore, she will be treated for UTI. She is given prescription for cephalexin and for her phenazopyridine and is to followup with PCP.   Dione Boozeavid Corabelle Spackman, MD 02/27/14 [phone]

## 2014-02-28 LAB — GC/CHLAMYDIA PROBE AMP
CT Probe RNA: NEGATIVE
GC PROBE AMP APTIMA: NEGATIVE

## 2014-11-16 ENCOUNTER — Emergency Department (HOSPITAL_COMMUNITY)
Admission: EM | Admit: 2014-11-16 | Discharge: 2014-11-16 | Disposition: A | Discharge status: Home / Self Care | Payer: Medicaid Other | Attending: Emergency Medicine | Admitting: Emergency Medicine

## 2014-11-16 ENCOUNTER — Encounter (HOSPITAL_COMMUNITY): Payer: Self-pay | Admitting: Emergency Medicine

## 2014-11-16 DIAGNOSIS — Z8659 Personal history of other mental and behavioral disorders: Secondary | ICD-10-CM | POA: Insufficient documentation

## 2014-11-16 DIAGNOSIS — Z792 Long term (current) use of antibiotics: Secondary | ICD-10-CM | POA: Insufficient documentation

## 2014-11-16 DIAGNOSIS — B86 Scabies: Secondary | ICD-10-CM | POA: Diagnosis not present

## 2014-11-16 DIAGNOSIS — Z791 Long term (current) use of non-steroidal anti-inflammatories (NSAID): Secondary | ICD-10-CM | POA: Insufficient documentation

## 2014-11-16 DIAGNOSIS — Z79899 Other long term (current) drug therapy: Secondary | ICD-10-CM | POA: Insufficient documentation

## 2014-11-16 DIAGNOSIS — Z720 Tobacco use: Secondary | ICD-10-CM | POA: Insufficient documentation

## 2014-11-16 DIAGNOSIS — Z8709 Personal history of other diseases of the respiratory system: Secondary | ICD-10-CM | POA: Insufficient documentation

## 2014-11-16 DIAGNOSIS — R21 Rash and other nonspecific skin eruption: Secondary | ICD-10-CM | POA: Diagnosis present

## 2014-11-16 HISTORY — DX: Anxiety disorder, unspecified: F41.9

## 2014-11-16 MED ORDER — HYDROXYZINE HCL 25 MG PO TABS
25.0000 mg | ORAL_TABLET | Freq: Once | ORAL | Status: AC
  Administered 2014-11-16: 25 mg via ORAL
  Filled 2014-11-16: qty 1

## 2014-11-16 MED ORDER — HYDROXYZINE HCL 25 MG PO TABS: 25.0000 mg | ORAL_TABLET | Freq: Four times a day (QID) | ORAL | Status: DC | PRN

## 2014-11-16 MED ORDER — PERMETHRIN 5 % EX CREA: TOPICAL_CREAM | CUTANEOUS | Status: DC

## 2014-11-16 NOTE — ED Provider Notes (Signed)
CSN: [id number]     Arrival date & time 11/16/14  1639 History   First MD Initiated Contact with Patient 11/16/14 1800     Chief Complaint  Patient presents with  . Rash     (Consider location/radiation/quality/duration/timing/severity/associated sxs/prior Treatment) The history is provided by the patient and medical records. No language interpreter was used.     Diana Holmes is a 34 y.o. female  with a hx of anxiety, bronchitis presents to the Emergency Department complaining of gradual, persistent, progressively worsening rash onset approx 1 week ago.  Pt reports she was seen by PCP 4 days ago and given doxycycline and hydroxyzine without relief.  Pt reports the rash is located on her neck, chest, back and bilateral arms.  She states that new eruptions occur every day, including today. She reports that it is intensely itchy. She states she was in an unknown person's house just before the rash began looking for her daughter's glasses. Patient has been taking her doxycycline and hydroxyzine without significant relief, nothing seems to make it better or worse.  Pt denies fever, chills, headache, neck pain, neck stiffness, chest pain, shortness of breath, abdominal pain, nausea, vomiting, diarrhea, weakness, dizziness, syncope, dysuria.  Patient denies new lotions, soaps, makeup, detergents, clothing or linens.   Past Medical History  Diagnosis Date  . Bronchitis   . Anxiety    Past Surgical History  Procedure Laterality Date  . Tubal ligation     History reviewed. No pertinent family history. History  Substance Use Topics  . Smoking status: Current Every Day Smoker -- 0.05 packs/day for 16 years    Types: Cigarettes  . Smokeless tobacco: Never Used  . Alcohol Use: No   OB History    Gravida Para Term Preterm AB TAB SAB Ectopic Multiple Living   Review of Systems  Constitutional: Negative for fever, diaphoresis, appetite change, fatigue and unexpected  weight change.  HENT: Negative for mouth sores.   Eyes: Negative for visual disturbance.  Respiratory: Negative for cough, chest tightness, shortness of breath and wheezing.   Cardiovascular: Negative for chest pain.  Gastrointestinal: Negative for nausea, vomiting, abdominal pain, diarrhea and constipation.  Endocrine: Negative for polydipsia, polyphagia and polyuria.  Genitourinary: Negative for dysuria, urgency, frequency and hematuria.  Musculoskeletal: Negative for back pain and neck stiffness.  Skin: Positive for rash.  Allergic/Immunologic: Negative for immunocompromised state.  Neurological: Negative for syncope, light-headedness and headaches.  Hematological: Does not bruise/bleed easily.  Psychiatric/Behavioral: Negative for sleep disturbance. The patient is not nervous/anxious.       Allergies  Review of patient's allergies indicates no known allergies.  Home Medications   Prior to Admission medications   Medication Sig Start Date End Date Taking? Authorizing Provider  doxycycline (VIBRA-TABS) 100 MG tablet Take 100 mg by mouth 2 (two) times daily. 10 day course starting on 11/06/2014   Yes Historical Provider, MD  fluconazole (DIFLUCAN) 150 MG tablet Take 150 mg by mouth every 30 (thirty) days.   Yes Historical Provider, MD  FLUoxetine (PROZAC) 20 MG capsule Take 20 mg by mouth daily.   Yes Historical Provider, MD  Probiotic Product (FLORAJEN3 PO) Take 1 capsule by mouth daily.   Yes Historical Provider, MD  amoxicillin (AMOXIL) 500 MG capsule Take 1 capsule (500 mg total) by mouth 3 (three) times daily. Patient not taking: Reported on 11/16/2014 01/31/14   Lyanne Co, MD  benzonatate (TESSALON) 100 MG capsule Take 2 capsules (200 mg total) by mouth 3 (three) times daily as needed for cough. Patient not taking: Reported on 11/16/2014 12/08/13   Burgess Amor, PA-C  cephALEXin (KEFLEX) 500 MG capsule Take 1 capsule (500 mg total) by mouth 4 (four) times daily. Patient not  taking: Reported on 11/16/2014 02/27/14   Dione Booze, MD  hydrOXYzine (ATARAX/VISTARIL) 25 MG tablet Take 1 tablet (25 mg total) by mouth every 6 (six) hours as needed for itching. 11/16/14   Laquanta Hummel, PA-C  ibuprofen (ADVIL,MOTRIN) 600 MG tablet Take 1 tablet (600 mg total) by mouth every 8 (eight) hours as needed. Patient not taking: Reported on 11/16/2014 01/31/14   Lyanne Co, MD  permethrin (ELIMITE) 5 % cream Apply to entire body once, leave on for 8 hours then rinse off; repeat in 1 week if symptoms persist 11/16/14   Dahlia Client Taje Tondreau, PA-C  phenazopyridine (PYRIDIUM) 200 MG tablet Take 1 tablet (200 mg total) by mouth 3 (three) times daily. Patient not taking: Reported on 11/16/2014 02/27/14   Dione Booze, MD  Phenylephrine-APAP-Guaifenesin [facility] FAST-MAX COLD & SINUS) 10-650-400 MG/20ML LIQD Take 20 mLs by mouth every 4 (four) hours as needed (cough, cold and congestion).    Historical Provider, MD  promethazine-codeine (PHENERGAN WITH CODEINE) 6.25-10 MG/5ML syrup Take 5 mLs by mouth every 4 (four) hours as needed for cough. Patient not taking: Reported on 11/16/2014 12/08/13   Burgess Amor, PA-C   BP 134/93 mmHg  Pulse 84  Temp(Src) 98.5 F (36.9 C) (Oral)  Resp 18  Ht  (1.676 m)  Wt 200 lb (90.719 kg)  BMI 32.30 kg/m2  SpO2 100%  LMP 10/09/2014 Physical Exam  Constitutional: She is oriented to person, place, and time. She appears well-developed and well-nourished. No distress.  HENT:  Head: Normocephalic and atraumatic.  Right Ear: Tympanic membrane, external ear and ear canal normal.  Left Ear: Tympanic membrane, external ear and ear canal normal.  Nose: Nose normal. No mucosal edema or rhinorrhea.  Mouth/Throat: Uvula is midline. No uvula swelling. No oropharyngeal exudate, posterior oropharyngeal edema, posterior oropharyngeal erythema or tonsillar abscesses.  No swelling of the uvula or oropharynx   Eyes: Conjunctivae are normal.  Neck: Normal range of  motion.  Patent airway No stridor; normal phonation Handling secretions without difficulty  Cardiovascular: Normal rate, normal heart sounds and intact distal pulses.   No murmur heard. Pulmonary/Chest: Effort normal and breath sounds normal. No stridor. No respiratory distress. She has no wheezes.  No wheezes or rhonchi  Abdominal: Soft. Bowel sounds are normal. There is no tenderness.  Musculoskeletal: Normal range of motion. She exhibits no edema.  Neurological: She is alert and oriented to person, place, and time.  Skin: Skin is warm and dry. Rash noted. She is not diaphoretic.  Scattered papules over the arms, chest, back and neck with mild edema and erythema No thickened excoriations and mild induration at the site of each papule consistent with mild secondary infection  Psychiatric: She has a normal mood and affect.  Nursing note and vitals reviewed.   ED Course  Procedures (including critical care time) Labs Review Labs Reviewed - No data to display  Imaging Review No results found.   EKG Interpretation None      MDM   Final diagnoses:  Scabies   Diana Holmes presents with persistent rash for approximately one week.  Patient is already being treated for itching with hydroxyzine and secondary infection with doxycycline.  We'll ask her to complete these medications. Rash not classically scabies however high potential exists. We'll treat for same.  They have been advised to followup with her primary care doctor 2 weeks after treatment.  They have also been advised to clean entire household including washing sheets and using R.I.D. spray in the car and on sofa.   The use of permethrin cream was discussed as well, they were told to use cream from head to toe & leave on for 8-12 hours.  They've been advised to repeat treatment if new eruptions occur. Patient's parents verbalized understanding.   BP 134/93 mmHg  Pulse 84  Temp(Src) 98.5 F (36.9 C) (Oral)  Resp 18  Ht  5\' 6"  (1.676 m)  Wt 200 lb (90.719 kg)  BMI 32.30 kg/m2  SpO2 100%  LMP 10/09/2014    Dierdre ForthHannah Easter Schinke, PA-C 11/16/14 1958  Geoffery Lyonsouglas Delo, MD 11/16/14 2000

## 2014-11-16 NOTE — Discharge Instructions (Signed)
1. Medications: atarax, permetherine, usual home medications - finish doxycycline 2. Treatment: rest, drink plenty of fluids,  3. Follow Up: Please followup with your primary doctor in 7 days for discussion of your diagnoses and further evaluation after today's visit; if you do not have a primary care doctor use the resource guide provided to find one; Please return to the ER for worsening symptoms, fevers or other concerns    Scabies Scabies are small bugs (mites) that burrow under the skin and cause red bumps and severe itching. These bugs can only be seen with a microscope. Scabies are highly contagious. They can spread easily from person to person by direct contact. They are also spread through sharing clothing or linens that have the scabies mites living in them. It is not unusual for an entire family to become infected through shared towels, clothing, or bedding.  HOME CARE INSTRUCTIONS   Your caregiver may prescribe a cream or lotion to kill the mites. If cream is prescribed, massage the cream into the entire body from the neck to the bottom of both feet. Also massage the cream into the scalp and face if your child is less than 33 year old. Avoid the eyes and mouth. Do not wash your hands after application.  Leave the cream on for 8 to 12 hours. Your child should bathe or shower after the 8 to 12 hour application period. Sometimes it is helpful to apply the cream to your child right before bedtime.  One treatment is usually effective and will eliminate approximately 95% of infestations. For severe cases, your caregiver may decide to repeat the treatment in 1 week. Everyone in your household should be treated with one application of the cream.  New rashes or burrows should not appear within 24 to 48 hours after successful treatment. However, the itching and rash may last for 2 to 4 weeks after successful treatment. Your caregiver may prescribe a medicine to help with the itching or to help the  rash go away more quickly.  Scabies can live on clothing or linens for up to 3 days. All of your child's recently used clothing, towels, stuffed toys, and bed linens should be washed in hot water and then dried in a dryer for at least 20 minutes on high heat. Items that cannot be washed should be enclosed in a plastic bag for at least 3 days.  To help relieve itching, bathe your child in a cool bath or apply cool washcloths to the affected areas.  Your child may return to school after treatment with the prescribed cream. SEEK MEDICAL CARE IF:   The itching persists longer than 4 weeks after treatment.  The rash spreads or becomes infected. Signs of infection include red blisters or yellow-tan crust. Document Released: 10/10/2005 Document Revised: 01/02/2012 Document Reviewed: 02/18/2009 [facility] Patient Information 2015 [employer]. This information is not intended to replace advice given to you by your health care provider. Make sure you discuss any questions you have with your health care provider.

## 2014-11-16 NOTE — ED Notes (Signed)
Patient c/o rash on neck, arms bilaterally, chest, and back. Patient reports itching. Patient see by PCP, unsure of what was causing rash. Patient given prescriptions for hydroxyzine and doxycycline in which patient is taking with no relief.

## 2014-11-16 NOTE — ED Notes (Signed)
Patient verbalizes understanding of discharge instructions, prescription medications, home care and follow up care if needed. Patient ambulatory out of department at this time with family. 

## 2015-03-26 ENCOUNTER — Encounter (HOSPITAL_COMMUNITY): Payer: Self-pay

## 2015-03-26 ENCOUNTER — Emergency Department (HOSPITAL_COMMUNITY)
Admission: EM | Admit: 2015-03-26 | Discharge: 2015-03-26 | Disposition: A | Discharge status: Home / Self Care | Payer: Medicaid Other | Attending: Emergency Medicine | Admitting: Emergency Medicine

## 2015-03-26 DIAGNOSIS — Z9851 Tubal ligation status: Secondary | ICD-10-CM | POA: Insufficient documentation

## 2015-03-26 DIAGNOSIS — Z3202 Encounter for pregnancy test, result negative: Secondary | ICD-10-CM | POA: Diagnosis not present

## 2015-03-26 DIAGNOSIS — R300 Dysuria: Secondary | ICD-10-CM | POA: Diagnosis present

## 2015-03-26 DIAGNOSIS — Z720 Tobacco use: Secondary | ICD-10-CM | POA: Diagnosis not present

## 2015-03-26 DIAGNOSIS — Z8709 Personal history of other diseases of the respiratory system: Secondary | ICD-10-CM | POA: Diagnosis not present

## 2015-03-26 DIAGNOSIS — N760 Acute vaginitis: Secondary | ICD-10-CM | POA: Insufficient documentation

## 2015-03-26 DIAGNOSIS — Z792 Long term (current) use of antibiotics: Secondary | ICD-10-CM | POA: Insufficient documentation

## 2015-03-26 DIAGNOSIS — Z79899 Other long term (current) drug therapy: Secondary | ICD-10-CM | POA: Insufficient documentation

## 2015-03-26 DIAGNOSIS — F419 Anxiety disorder, unspecified: Secondary | ICD-10-CM | POA: Diagnosis not present

## 2015-03-26 DIAGNOSIS — B9689 Other specified bacterial agents as the cause of diseases classified elsewhere: Secondary | ICD-10-CM

## 2015-03-26 LAB — URINALYSIS, ROUTINE W REFLEX MICROSCOPIC
Bilirubin Urine: NEGATIVE
Glucose, UA: NEGATIVE mg/dL
HGB URINE DIPSTICK: NEGATIVE
Ketones, ur: NEGATIVE mg/dL
Leukocytes, UA: NEGATIVE
Nitrite: NEGATIVE
Protein, ur: NEGATIVE mg/dL
SPECIFIC GRAVITY, URINE: 1.025 (ref 1.005–1.030)
Urobilinogen, UA: 1 mg/dL (ref 0.0–1.0)
pH: 6.5 (ref 5.0–8.0)

## 2015-03-26 LAB — WET PREP, GENITAL
Trich, Wet Prep: NONE SEEN
Yeast Wet Prep HPF POC: NONE SEEN

## 2015-03-26 LAB — POC URINE PREG, ED: PREG TEST UR: NEGATIVE

## 2015-03-26 LAB — PREGNANCY, URINE: Preg Test, Ur: NEGATIVE

## 2015-03-26 MED ORDER — PHENAZOPYRIDINE HCL 200 MG PO TABS: 200.0000 mg | ORAL_TABLET | Freq: Three times a day (TID) | ORAL | Status: DC

## 2015-03-26 MED ORDER — METRONIDAZOLE 500 MG PO TABS: 500.0000 mg | ORAL_TABLET | Freq: Two times a day (BID) | ORAL | Status: DC

## 2015-03-26 MED ORDER — CEPHALEXIN 500 MG PO CAPS: 500.0000 mg | ORAL_CAPSULE | Freq: Four times a day (QID) | ORAL | Status: DC

## 2015-03-26 NOTE — ED Provider Notes (Signed)
CSN: [id number]     Arrival date & time 03/26/15  1528 History   First MD Initiated Contact with Patient 03/26/15 1603     Chief Complaint  Patient presents with  . Dysuria     (Consider location/radiation/quality/duration/timing/severity/associated sxs/prior Treatment) HPI   Diana Holmes is a 34 y.o. female who presents to the Emergency Department complaining of vaginal itching and discharge with burning upon urination.  Symptoms began four days ago.  She states that she has frequently problems with yeast infections and her GYN has started her on a once monthly diflucan tablet which she took this morning.  She reports having these symptoms frequently since having a tubal ligation.  She denies vaginal bleeding, abdominal pain, fever, bloody urine or difficulty urinating.  She has an appt with her GYN on 03/31/15.     Past Medical History  Diagnosis Date  . Bronchitis   . Anxiety    Past Surgical History  Procedure Laterality Date  . Tubal ligation     No family history on file. History  Substance Use Topics  . Smoking status: Current Every Day Smoker -- 0.05 packs/day for 16 years    Types: Cigarettes  . Smokeless tobacco: Never Used  . Alcohol Use: No   OB History    Gravida Para Term Preterm AB TAB SAB Ectopic Multiple Living   Review of Systems  Constitutional: Negative for fever and chills.  Gastrointestinal: Negative for nausea, vomiting and abdominal pain.  Genitourinary: Positive for dysuria. Negative for hematuria, flank pain, vaginal discharge, difficulty urinating, genital sores and vaginal pain.  All other systems reviewed and are negative.     Allergies  Review of patient's allergies indicates no known allergies.  Home Medications   Prior to Admission medications   Medication Sig Start Date End Date Taking? Authorizing Provider  amoxicillin (AMOXIL) 500 MG capsule Take 1 capsule (500 mg total) by mouth 3 (three) times  daily. Patient not taking: Reported on 11/16/2014 01/31/14   Azalia Bilis, MD  benzonatate (TESSALON) 100 MG capsule Take 2 capsules (200 mg total) by mouth 3 (three) times daily as needed for cough. Patient not taking: Reported on 11/16/2014 12/08/13   Burgess Amor, PA-C  cephALEXin (KEFLEX) 500 MG capsule Take 1 capsule (500 mg total) by mouth 4 (four) times daily. Patient not taking: Reported on 11/16/2014 02/27/14   Dione Booze, MD  doxycycline (VIBRA-TABS) 100 MG tablet Take 100 mg by mouth 2 (two) times daily. 10 day course starting on 11/06/2014    Historical Provider, MD  fluconazole (DIFLUCAN) 150 MG tablet Take 150 mg by mouth every 30 (thirty) days.    Historical Provider, MD  FLUoxetine (PROZAC) 20 MG capsule Take 20 mg by mouth daily.    Historical Provider, MD  hydrOXYzine (ATARAX/VISTARIL) 25 MG tablet Take 1 tablet (25 mg total) by mouth every 6 (six) hours as needed for itching. 11/16/14   Hannah Muthersbaugh, PA-C  ibuprofen (ADVIL,MOTRIN) 600 MG tablet Take 1 tablet (600 mg total) by mouth every 8 (eight) hours as needed. Patient not taking: Reported on 11/16/2014 01/31/14   Azalia Bilis, MD  permethrin (ELIMITE) 5 % cream Apply to entire body once, leave on for 8 hours then rinse off; repeat in 1 week if symptoms persist 11/16/14   Dahlia Client Muthersbaugh, PA-C  phenazopyridine (PYRIDIUM) 200 MG tablet Take 1 tablet (200 mg total) by mouth 3 (three) times daily.  Patient not taking: Reported on 11/16/2014 02/27/14   Dione Boozeavid Glick, MD  Phenylephrine-APAP-Guaifenesin [facility](MUCINEX FAST-MAX COLD & SINUS) 10-650-400 MG/20ML LIQD Take 20 mLs by mouth every 4 (four) hours as needed (cough, cold and congestion).    Historical Provider, MD  Probiotic Product (FLORAJEN3 PO) Take 1 capsule by mouth daily.    Historical Provider, MD  promethazine-codeine (PHENERGAN WITH CODEINE) 6.25-10 MG/5ML syrup Take 5 mLs by mouth every 4 (four) hours as needed for cough. Patient not taking: Reported on 11/16/2014 12/08/13   Burgess AmorJulie  Idol, PA-C   BP 129/86 mmHg  Pulse 106  Temp(Src) 99 F (37.2 C) (Oral)  Resp 18  Ht 5\' 4"  (1.626 m)  Wt 200 lb (90.719 kg)  BMI 34.31 kg/m2  SpO2 97%  LMP 03/13/2015 Physical Exam  Constitutional: She is oriented to person, place, and time. She appears well-developed and well-nourished. No distress.  HENT:  Head: Normocephalic and atraumatic.  Cardiovascular: Normal rate, regular rhythm, normal heart sounds and intact distal pulses.   No murmur heard. Pulmonary/Chest: Effort normal and breath sounds normal. No respiratory distress. She has no wheezes. She has no rales.  Abdominal: Soft. Normal appearance. She exhibits no distension and no mass. There is no hepatosplenomegaly. There is tenderness in the suprapubic area. There is no rigidity, no rebound, no guarding, no CVA tenderness and no tenderness at McBurney's point.  Mild ttp of the suprapubic region.  Remaining abdomen is soft, non-tender without guarding or rebound tenderness. No CVA tenderness  Genitourinary: Uterus normal. There is no tenderness or lesion on the right labia. There is no tenderness or lesion on the left labia. Cervix exhibits no motion tenderness and no discharge. Right adnexum displays no mass and no tenderness. Left adnexum displays no mass and no tenderness. There is bleeding in the vagina. No tenderness in the vagina. No foreign body around the vagina. Vaginal discharge found.  Musculoskeletal: Normal range of motion. She exhibits no edema.  Neurological: She is alert and oriented to person, place, and time. Coordination normal.  Skin: Skin is warm and dry. No rash noted.  Nursing note and vitals reviewed.   ED Course  Procedures (including critical care time) Labs Review Labs Reviewed  WET PREP, GENITAL  URINALYSIS, ROUTINE W REFLEX MICROSCOPIC (NOT [facility])  PREGNANCY, URINE  POC URINE PREG, ED  GC/CHLAMYDIA PROBE AMP ([place]) [facility]    Imaging Review No results found.   EKG  Interpretation None     GC, chlamydia cultures pending  MDM   Final diagnoses:  Dysuria  Bacterial vaginosis   Previous labs and ED chart reviewed from 5/15  Pt has been here previously for same.  Sx's likely related to vaginitis. U/A is wnml.  Previous GC and chlamydia culture fm 5/7 was neg.  Patient takes once monthly Diflucan for yeast and took her medication this morning.  Pt is well appearing.  No concerning sx's for TOA or PID.  She agrees to close f/u with her PMD if needed.   Pauline Ausammy Aashish Hamm, PA-C 04/01/15 1619  Bethann BerkshireJoseph Zammit, MD 04/01/15 2056

## 2015-03-26 NOTE — Discharge Instructions (Signed)
Dysuria Dysuria is the medical term for pain with urination. There are many causes for dysuria, but urinary tract infection is the most common. If a urinalysis was performed it can show that there is a urinary tract infection. A urine culture confirms that you or your child is sick. You will need to follow up with a healthcare provider because:  If a urine culture was done you will need to know the culture results and treatment recommendations.  If the urine culture was positive, you or your child will need to be put on antibiotics or know if the antibiotics prescribed are the right antibiotics for your urinary tract infection.  If the urine culture is negative (no urinary tract infection), then other causes may need to be explored or antibiotics need to be stopped. Today laboratory work may have been done and there does not seem to be an infection. If cultures were done they will take at least 24 to 48 hours to be completed. Today x-rays may have been taken and they read as normal. No cause can be found for the problems. The x-rays may be re-read by a radiologist and you will be contacted if additional findings are made. You or your child may have been put on medications to help with this problem until you can see your primary caregiver. If the problems get better, see your primary caregiver if the problems return. If you were given antibiotics (medications which kill germs), take all of the mediations as directed for the full course of treatment.  If laboratory work was done, you need to find the results. Leave a telephone number where you can be reached. If this is not possible, make sure you find out how you are to get test results. HOME CARE INSTRUCTIONS   Drink lots of fluids. For adults, drink eight, 8 ounce glasses of clear juice or water a day. For children, replace fluids as suggested by your caregiver.  Empty the bladder often. Avoid holding urine for long periods of time.  After a bowel  movement, women should cleanse front to back, using each tissue only once.  Empty your bladder before and after sexual intercourse.  Take all the medicine given to you until it is gone. You may feel better in a few days, but TAKE ALL MEDICINE.  Avoid caffeine, tea, alcohol and carbonated beverages, because they tend to irritate the bladder.  In men, alcohol may irritate the prostate.  Only take over-the-counter or prescription medicines for pain, discomfort, or fever as directed by your caregiver.  If your caregiver has given you a follow-up appointment, it is very important to keep that appointment. Not keeping the appointment could result in a chronic or permanent injury, pain, and disability. If there is any problem keeping the appointment, you must call back to this facility for assistance. SEEK IMMEDIATE MEDICAL CARE IF:   Back pain develops.  A fever develops.  There is nausea (feeling sick to your stomach) or vomiting (throwing up).  Problems are no better with medications or are getting worse. MAKE SURE YOU:   Understand these instructions.  Will watch your condition.  Will get help right away if you are not doing well or get worse. Document Released: 07/08/2004 Document Revised: 01/02/2012 Document Reviewed: 05/15/2008 [facility] Patient Information 2015 [place], [place]. This information is not intended to replace advice given to you by your health care provider. Make sure you discuss any questions you have with your health care provider.  Bacterial Vaginosis Bacterial  vaginosis is an infection of the vagina. It happens when too many of certain germs (bacteria) grow in the vagina. HOME CARE  Take your medicine as told by your doctor.  Finish your medicine even if you start to feel better.  Do not have sex until you finish your medicine and are better.  Tell your sex partner that you have an infection. They should see their doctor for treatment.  Practice safe sex.  Use condoms. Have only one sex partner. GET HELP IF:  You are not getting better after 3 days of treatment.  You have more grey fluid (discharge) coming from your vagina than before.  You have more pain than before.  You have a fever. MAKE SURE YOU:   Understand these instructions.  Will watch your condition.  Will get help right away if you are not doing well or get worse. Document Released: 07/19/2008 Document Revised: 07/31/2013 Document Reviewed: 05/22/2013 [facility] Patient Information 2015 [place]. This information is not intended to replace advice given to you by your health care provider. Make sure you discuss any questions you have with your health care provider.

## 2015-03-26 NOTE — ED Notes (Signed)
Pt c/o vaginal itching, burning, and burning with urination since Sunday night.  Denies abnormal vaginal bleeding but has a thick discharge.

## 2015-03-27 LAB — GC/CHLAMYDIA PROBE AMP (~~LOC~~) NOT AT ARMC
CHLAMYDIA, DNA PROBE: NEGATIVE
Neisseria Gonorrhea: NEGATIVE

## 2016-02-17 ENCOUNTER — Emergency Department (HOSPITAL_COMMUNITY)
Admission: EM | Admit: 2016-02-17 | Discharge: 2016-02-17 | Disposition: A | Discharge status: Home / Self Care | Payer: Medicaid Other | Attending: Emergency Medicine | Admitting: Emergency Medicine

## 2016-02-17 ENCOUNTER — Encounter (HOSPITAL_COMMUNITY): Payer: Self-pay | Admitting: Emergency Medicine

## 2016-02-17 DIAGNOSIS — Z79899 Other long term (current) drug therapy: Secondary | ICD-10-CM | POA: Insufficient documentation

## 2016-02-17 DIAGNOSIS — N72 Inflammatory disease of cervix uteri: Secondary | ICD-10-CM | POA: Diagnosis not present

## 2016-02-17 DIAGNOSIS — F17210 Nicotine dependence, cigarettes, uncomplicated: Secondary | ICD-10-CM | POA: Diagnosis not present

## 2016-02-17 DIAGNOSIS — N760 Acute vaginitis: Secondary | ICD-10-CM | POA: Insufficient documentation

## 2016-02-17 DIAGNOSIS — R300 Dysuria: Secondary | ICD-10-CM | POA: Diagnosis present

## 2016-02-17 DIAGNOSIS — B9689 Other specified bacterial agents as the cause of diseases classified elsewhere: Secondary | ICD-10-CM

## 2016-02-17 LAB — WET PREP, GENITAL
SPERM: NONE SEEN
Trich, Wet Prep: NONE SEEN
YEAST WET PREP: NONE SEEN

## 2016-02-17 LAB — URINALYSIS, ROUTINE W REFLEX MICROSCOPIC
Bilirubin Urine: NEGATIVE
Glucose, UA: NEGATIVE mg/dL
KETONES UR: NEGATIVE mg/dL
LEUKOCYTES UA: NEGATIVE
Nitrite: NEGATIVE
PH: 6 (ref 5.0–8.0)
Protein, ur: NEGATIVE mg/dL
SPECIFIC GRAVITY, URINE: 1.025 (ref 1.005–1.030)

## 2016-02-17 LAB — URINE MICROSCOPIC-ADD ON

## 2016-02-17 LAB — PREGNANCY, URINE: Preg Test, Ur: NEGATIVE

## 2016-02-17 MED ORDER — PHENAZOPYRIDINE HCL 200 MG PO TABS: 200.0000 mg | ORAL_TABLET | Freq: Three times a day (TID) | ORAL | Status: DC | PRN

## 2016-02-17 MED ORDER — DOXYCYCLINE HYCLATE 100 MG PO CAPS: 100.0000 mg | ORAL_CAPSULE | Freq: Two times a day (BID) | ORAL | Status: DC

## 2016-02-17 MED ORDER — LIDOCAINE HCL (PF) 1 % IJ SOLN
INTRAMUSCULAR | Status: AC
  Administered 2016-02-17: 5 mL
  Filled 2016-02-17: qty 5

## 2016-02-17 MED ORDER — CEFTRIAXONE SODIUM 250 MG IJ SOLR
250.0000 mg | Freq: Once | INTRAMUSCULAR | Status: AC
  Administered 2016-02-17: 250 mg via INTRAMUSCULAR
  Filled 2016-02-17: qty 250

## 2016-02-17 MED ORDER — METRONIDAZOLE 500 MG PO TABS: 500.0000 mg | ORAL_TABLET | Freq: Two times a day (BID) | ORAL | Status: DC

## 2016-02-17 NOTE — ED Notes (Signed)
Patient complaining of lower abdominal pain radiating into back and burning with urination x 3 days. States "my vaginal area is burning all the time and I'm peeing a lot and it really burns when I pee."

## 2016-02-17 NOTE — Discharge Instructions (Signed)
Cervicitis °Cervicitis is a soreness and swelling (inflammation) of the cervix. Your cervix is located at the bottom of your uterus. It opens up to the vagina. °CAUSES  °· Sexually transmitted infections (STIs).   °· Allergic reaction.   °· Medicines or birth control devices that are put in the vagina.   °· Injury to the cervix.   °· Bacterial infections.   °RISK FACTORS °You are at greater risk if you: °· Have unprotected sexual intercourse. °· Have sexual intercourse with many partners. °· Began sexual intercourse at an early age. °· Have a history of STIs. °SYMPTOMS  °There may be no symptoms. If symptoms occur, they may include:  °· Gray, white, yellow, or bad-smelling vaginal discharge.   °· Pain or itching of the area outside the vagina.   °· Painful sexual intercourse.   °· Lower abdominal or lower back pain, especially during intercourse.   °· Frequent urination.   °· Abnormal vaginal bleeding between periods, after sexual intercourse, or after menopause.   °· Pressure or a heavy feeling in the pelvis.   °DIAGNOSIS  °Diagnosis is made after a pelvic exam. Other tests may include:  °· Examination of any discharge under a microscope (wet prep).   °· A Pap test.   °TREATMENT  °Treatment will depend on the cause of cervicitis. If it is caused by an STI, both you and your partner will need to be treated. Antibiotic medicines will be given.  °HOME CARE INSTRUCTIONS  °· Do not have sexual intercourse until your health care provider says it is okay.   °· Do not have sexual intercourse until your partner has been treated, if your cervicitis is caused by an STI.   °· Take your antibiotics as directed. Finish them even if you start to feel better.   °SEEK MEDICAL CARE IF: °· Your symptoms come back.   °· You have a fever.   °MAKE SURE YOU:  °· Understand these instructions. °· Will watch your condition. °· Will get help right away if you are not doing well or get worse. °  °This information is not intended to replace  advice given to you by your health care provider. Make sure you discuss any questions you have with your health care provider. °  °Document Released: 10/10/2005 Document Revised: 10/15/2013 Document Reviewed: 04/03/2013 °Elsevier Interactive Patient Education ©2016 Elsevier Inc. ° °Bacterial Vaginosis °Bacterial vaginosis is a vaginal infection that occurs when the normal balance of bacteria in the vagina is disrupted. It results from an overgrowth of certain bacteria. This is the most common vaginal infection in women of childbearing age. Treatment is important to prevent complications, especially in pregnant women, as it can cause a premature delivery. °CAUSES  °Bacterial vaginosis is caused by an increase in harmful bacteria that are normally present in smaller amounts in the vagina. Several different kinds of bacteria can cause bacterial vaginosis. However, the reason that the condition develops is not fully understood. °RISK FACTORS °Certain activities or behaviors can put you at an increased risk of developing bacterial vaginosis, including: °· Having a new sex partner or multiple sex partners. °· Douching. °· Using an intrauterine device (IUD) for contraception. °Women do not get bacterial vaginosis from toilet seats, bedding, swimming pools, or contact with objects around them. °SIGNS AND SYMPTOMS  °Some women with bacterial vaginosis have no signs or symptoms. Common symptoms include: °· Grey vaginal discharge. °· A fishlike odor with discharge, especially after sexual intercourse. °· Itching or burning of the vagina and vulva. °· Burning or pain with urination. °DIAGNOSIS  °Your health care provider will take a   medical history and examine the vagina for signs of bacterial vaginosis. A sample of vaginal fluid may be taken. Your health care provider will look at this sample under a microscope to check for bacteria and abnormal cells. A vaginal pH test may also be done.  TREATMENT  Bacterial vaginosis may  be treated with antibiotic medicines. These may be given in the form of a pill or a vaginal cream. A second round of antibiotics may be prescribed if the condition comes back after treatment. Because bacterial vaginosis increases your risk for sexually transmitted diseases, getting treated can help reduce your risk for chlamydia, gonorrhea, HIV, and herpes. HOME CARE INSTRUCTIONS   Only take over-the-counter or prescription medicines as directed by your health care provider.  If antibiotic medicine was prescribed, take it as directed. Make sure you finish it even if you start to feel better.  Tell all sexual partners that you have a vaginal infection. They should see their health care provider and be treated if they have problems, such as a mild rash or itching.  During treatment, it is important that you follow these instructions:  Avoid sexual activity or use condoms correctly.  Do not douche.  Avoid alcohol as directed by your health care provider.  Avoid breastfeeding as directed by your health care provider. SEEK MEDICAL CARE IF:   Your symptoms are not improving after 3 days of treatment.  You have increased discharge or pain.  You have a fever. MAKE SURE YOU:   Understand these instructions.  Will watch your condition.  Will get help right away if you are not doing well or get worse. FOR MORE INFORMATION  Centers for Disease Control and Prevention, Division of STD Prevention: SolutionApps.co.zawww.cdc.gov/std American Sexual Health Association (ASHA): www.ashastd.org    This information is not intended to replace advice given to you by your health care provider. Make sure you discuss any questions you have with your health care provider.   Document Released: 10/10/2005 Document Revised: 10/31/2014 Document Reviewed: 05/22/2013 Elsevier Interactive Patient Education [employer]2016 Elsevier Inc.

## 2016-02-17 NOTE — ED Provider Notes (Signed)
CSN: [id number]     Arrival date & time 02/17/16  1858 History   First MD Initiated Contact with Patient 02/17/16 1930     Chief Complaint  Patient presents with  . Back Pain  . Dysuria     (Consider location/radiation/quality/duration/timing/severity/associated sxs/prior Treatment) HPI patient presents with dysuria and frequency over the last 3 days. She also states she has turning to the vaginal area. She's noticed a thick white discharge. She also has bilateral low back pain associated with the symptoms. States she suspects her boyfriend sleeping with of people and does not always use condoms during sexual intercourse. Denies any new rash, fever or chills. No nausea vomiting.  Past Medical History  Diagnosis Date  . Bronchitis   . Anxiety    Past Surgical History  Procedure Laterality Date  . Tubal ligation     History reviewed. No pertinent family history. Social History  Substance Use Topics  . Smoking status: Current Every Day Smoker -- 0.05 packs/day for 16 years    Types: Cigarettes  . Smokeless tobacco: Never Used  . Alcohol Use: No   OB History    Gravida Para Term Preterm AB TAB SAB Ectopic Multiple Living   4 4 4       4      Review of Systems  Constitutional: Negative for fever and chills.  Respiratory: Negative for shortness of breath.   Cardiovascular: Negative for chest pain.  Gastrointestinal: Negative for nausea, vomiting, abdominal pain, diarrhea and constipation.  Genitourinary: Positive for dysuria, frequency, vaginal discharge and vaginal pain. Negative for flank pain, vaginal bleeding, difficulty urinating and pelvic pain.  Musculoskeletal: Positive for myalgias and back pain. Negative for neck pain and neck stiffness.  Skin: Negative for rash and wound.  Neurological: Negative for dizziness, weakness, light-headedness, numbness and headaches.  All other systems reviewed and are negative.     Allergies  Review of patient's allergies indicates no  known allergies.  Home Medications   Prior to Admission medications   Medication Sig Start Date End Date Taking? Authorizing Provider  fluconazole (DIFLUCAN) 150 MG tablet Take 150 mg by mouth every 30 (thirty) days.   Yes Historical Provider, MD  doxycycline (VIBRAMYCIN) 100 MG capsule Take 1 capsule (100 mg total) by mouth 2 (two) times daily. One po bid x 7 days 02/17/16   Loren Raceravid Dinna Severs, MD  metroNIDAZOLE (FLAGYL) 500 MG tablet Take 1 tablet (500 mg total) by mouth 2 (two) times daily. One po bid x 7 days 02/17/16   Loren Raceravid Smantha Boakye, MD  phenazopyridine (PYRIDIUM) 200 MG tablet Take 1 tablet (200 mg total) by mouth 3 (three) times daily as needed for pain. 02/17/16   Loren Raceravid Roosevelt Eimers, MD   BP 138/81 mmHg  Pulse 111  Temp(Src) 98.6 F (37 C) (Oral)  Resp 17  Ht 5\' 3"  (1.6 m)  Wt 198 lb (89.812 kg)  BMI 35.08 kg/m2  LMP 01/24/2016 Physical Exam  Constitutional: She is oriented to person, place, and time. She appears well-developed and well-nourished. No distress.  HENT:  Head: Normocephalic and atraumatic.  Mouth/Throat: Oropharynx is clear and moist.  Eyes: EOM are normal. Pupils are equal, round, and reactive to light.  Neck: Normal range of motion. Neck supple.  Cardiovascular: Normal rate and regular rhythm.   Pulmonary/Chest: Effort normal and breath sounds normal. No respiratory distress. She has no wheezes. She has no rales. She exhibits no tenderness.  Abdominal: Soft. Bowel sounds are normal. She exhibits no distension and no mass.  There is no tenderness. There is no rebound and no guarding.  Genitourinary: Vaginal discharge found.  No pelvic rashes or lesions appreciated. Patient does have a white vaginal discharge. Some cervical motion discomfort and mild tenderness over the fundus of uterus and the left adnexa. No masses appreciated.  Musculoskeletal: Normal range of motion. She exhibits no edema or tenderness.  No CVA tenderness. No midline thoracic or lumbar tenderness.  Patient does have mild bilateral lower lumbar paraspinal tenderness. No lower extremity swelling or pain.  Neurological: She is alert and oriented to person, place, and time.  5/5 motor in all extremities. Sensation is intact.  Skin: Skin is warm and dry. No rash noted. No erythema.  Psychiatric: She has a normal mood and affect. Her behavior is normal.  Nursing note and vitals reviewed.   ED Course  Procedures (including critical care time) Labs Review Labs Reviewed  WET PREP, GENITAL - Abnormal; Notable for the following:    Clue Cells Wet Prep HPF POC PRESENT (*)    WBC, Wet Prep HPF POC FEW (*)    All other components within normal limits  URINALYSIS, ROUTINE W REFLEX MICROSCOPIC (NOT AT [facility]) - Abnormal; Notable for the following:    Hgb urine dipstick TRACE (*)    All other components within normal limits  URINE MICROSCOPIC-ADD ON - Abnormal; Notable for the following:    Squamous Epithelial / LPF 0-5 (*)    Bacteria, UA FEW (*)    All other components within normal limits  PREGNANCY, URINE  GC/CHLAMYDIA PROBE AMP ([place]) NOT AT [facility]    Imaging Review No results found. I have personally reviewed and evaluated these images and lab results as part of my medical decision-making.   EKG Interpretation None      MDM   Final diagnoses:  Cervicitis  Bacterial vaginosis   Patient with you white blood cells and clue cells. Given I am Rocephin and will start on Doxy and Flagyl. Patient understands need to follow-up with her primary physician to have all sexual partner treated. Low suspicion for tubo-ovarian abscess or intra-abdominal infectious process. Return precautions given     Loren Racer, MD 02/17/16 2106

## 2016-02-19 LAB — GC/CHLAMYDIA PROBE AMP (~~LOC~~) NOT AT ARMC
CHLAMYDIA, DNA PROBE: NEGATIVE
Neisseria Gonorrhea: NEGATIVE

## 2016-02-23 ENCOUNTER — Telehealth (HOSPITAL_BASED_OUTPATIENT_CLINIC_OR_DEPARTMENT_OTHER): Payer: Self-pay | Admitting: Emergency Medicine

## 2016-02-24 ENCOUNTER — Emergency Department (HOSPITAL_COMMUNITY)
Admission: EM | Admit: 2016-02-24 | Discharge: 2016-02-24 | Disposition: A | Discharge status: Home / Self Care | Payer: Medicaid Other | Attending: Emergency Medicine | Admitting: Emergency Medicine

## 2016-02-24 ENCOUNTER — Encounter (HOSPITAL_COMMUNITY): Payer: Self-pay | Admitting: Emergency Medicine

## 2016-02-24 DIAGNOSIS — Z3202 Encounter for pregnancy test, result negative: Secondary | ICD-10-CM | POA: Insufficient documentation

## 2016-02-24 DIAGNOSIS — Z9851 Tubal ligation status: Secondary | ICD-10-CM | POA: Insufficient documentation

## 2016-02-24 DIAGNOSIS — Z8709 Personal history of other diseases of the respiratory system: Secondary | ICD-10-CM | POA: Insufficient documentation

## 2016-02-24 DIAGNOSIS — F17210 Nicotine dependence, cigarettes, uncomplicated: Secondary | ICD-10-CM | POA: Insufficient documentation

## 2016-02-24 DIAGNOSIS — N898 Other specified noninflammatory disorders of vagina: Secondary | ICD-10-CM | POA: Insufficient documentation

## 2016-02-24 DIAGNOSIS — Z8659 Personal history of other mental and behavioral disorders: Secondary | ICD-10-CM | POA: Diagnosis not present

## 2016-02-24 DIAGNOSIS — R109 Unspecified abdominal pain: Secondary | ICD-10-CM | POA: Diagnosis present

## 2016-02-24 DIAGNOSIS — N949 Unspecified condition associated with female genital organs and menstrual cycle: Secondary | ICD-10-CM

## 2016-02-24 LAB — WET PREP, GENITAL
Clue Cells Wet Prep HPF POC: NONE SEEN
SPERM: NONE SEEN
Trich, Wet Prep: NONE SEEN
YEAST WET PREP: NONE SEEN

## 2016-02-24 LAB — URINALYSIS, ROUTINE W REFLEX MICROSCOPIC
BILIRUBIN URINE: NEGATIVE
Glucose, UA: NEGATIVE mg/dL
KETONES UR: NEGATIVE mg/dL
Leukocytes, UA: NEGATIVE
NITRITE: NEGATIVE
PH: 7 (ref 5.0–8.0)
PROTEIN: NEGATIVE mg/dL
Specific Gravity, Urine: 1.007 (ref 1.005–1.030)

## 2016-02-24 LAB — COMPREHENSIVE METABOLIC PANEL
ALK PHOS: 58 U/L (ref 38–126)
ALT: 28 U/L (ref 14–54)
AST: 24 U/L (ref 15–41)
Albumin: 4.4 g/dL (ref 3.5–5.0)
Anion gap: 10 (ref 5–15)
BILIRUBIN TOTAL: 0.5 mg/dL (ref 0.3–1.2)
BUN: 7 mg/dL (ref 6–20)
CALCIUM: 9.6 mg/dL (ref 8.9–10.3)
CO2: 26 mmol/L (ref 22–32)
Chloride: 106 mmol/L (ref 101–111)
Creatinine, Ser: 0.75 mg/dL (ref 0.44–1.00)
GFR calc Af Amer: >60 mL/min (ref >60)
GFR calc non Af Amer: >60 mL/min (ref >60)
Glucose, Bld: 101 mg/dL — ABNORMAL HIGH (ref 65–99)
Potassium: 4.1 mmol/L (ref 3.5–5.1)
Sodium: 142 mmol/L (ref 135–145)
TOTAL PROTEIN: 7.5 g/dL (ref 6.5–8.1)

## 2016-02-24 LAB — URINE MICROSCOPIC-ADD ON

## 2016-02-24 LAB — CBC
HEMATOCRIT: 40.6 % (ref 36.0–46.0)
Hemoglobin: 13.8 g/dL (ref 12.0–15.0)
MCH: 31.1 pg (ref 26.0–34.0)
MCHC: 34 g/dL (ref 30.0–36.0)
MCV: 91.4 fL (ref 78.0–100.0)
PLATELETS: 324 10*3/uL (ref 150–400)
RBC: 4.44 MIL/uL (ref 3.87–5.11)
RDW: 12.8 % (ref 11.5–15.5)
WBC: 5.9 10*3/uL (ref 4.0–10.5)

## 2016-02-24 LAB — LIPASE, BLOOD: Lipase: 23 U/L (ref 11–51)

## 2016-02-24 LAB — PREGNANCY, URINE: Preg Test, Ur: NEGATIVE

## 2016-02-24 NOTE — ED Notes (Signed)
Pt c/o abdominal pain and burning in vaginal area. Seen in the ER and Diagnosed with BV and given flagyl doxy and shes on her last day and states the pain and burning hasn't gone away and her symptoms are not improved. Pt in NAD at this time.

## 2016-02-24 NOTE — Discharge Instructions (Signed)
We recommend that you follow up with your OB/GYN regarding your persistent symptoms. Finish the course of your antibiotics as prescribed to you previously. Take Tylenol or ibuprofen for any persistent discomfort.

## 2016-02-24 NOTE — ED Provider Notes (Signed)
CSN: [id number]     Arrival date & time 02/24/16  2030 History   First MD Initiated Contact with Patient 02/24/16 2226     Chief Complaint  Patient presents with  . Vaginal Pain  . Abdominal Pain     (Consider location/radiation/quality/duration/timing/severity/associated sxs/prior Treatment) HPI Comments: 35 year old female presents to the emergency department for evaluation of intermittent pelvic discomfort which has been persistent since the patient was last seen 1 week ago. She states that she has a burning discomfort which is without modifying factors. The patient has been on doxycycline and Flagyl for presumed BV and PID. The patient states that this has improved her vaginal discharge which was thick and white with a fishy odor; reported at the patient's prior visit. Patient denies taking any other medications for her symptoms. No tylenol or ibuprofen. Patient states that she is currently on her menses. She denies sexual intercourse since her last ED encounter. She has not yet followed up with her OBGYN. Patient denies fevers, nausea, vomiting, diarrhea, melena, and hematochezia.  Patient is a 35 y.o. female presenting with vaginal pain and abdominal pain. The history is provided by the patient. No language interpreter was used.  Vaginal Pain Associated symptoms include abdominal pain. Pertinent negatives include no fever, nausea or vomiting.  Abdominal Pain Associated symptoms: vaginal bleeding (on menses)   Associated symptoms: no fever, no nausea, no vaginal discharge and no vomiting     Past Medical History  Diagnosis Date  . Bronchitis   . Anxiety    Past Surgical History  Procedure Laterality Date  . Tubal ligation     No family history on file. Social History  Substance Use Topics  . Smoking status: Current Every Day Smoker -- 0.05 packs/day for 16 years    Types: Cigarettes  . Smokeless tobacco: Never Used  . Alcohol Use: No   OB History    Gravida Para Term  Preterm AB TAB SAB Ectopic Multiple Living   4 4 4       4       Review of Systems  Constitutional: Negative for fever.  Gastrointestinal: Positive for abdominal pain. Negative for nausea and vomiting.  Genitourinary: Positive for vaginal bleeding (on menses), vaginal pain and pelvic pain. Negative for vaginal discharge.  All other systems reviewed and are negative.   Allergies  Review of patient's allergies indicates no known allergies.  Home Medications   Prior to Admission medications   Medication Sig Start Date End Date Taking? Authorizing Provider  doxycycline (VIBRAMYCIN) 100 MG capsule Take 1 capsule (100 mg total) by mouth 2 (two) times daily. One po bid x 7 days 02/17/16  Yes Loren Raceravid [place], MD  fluconazole (DIFLUCAN) 150 MG tablet Take 150 mg by mouth every 30 (thirty) days.   Yes Historical Provider, MD  metroNIDAZOLE (FLAGYL) 500 MG tablet Take 1 tablet (500 mg total) by mouth 2 (two) times daily. One po bid x 7 days 02/17/16  Yes Loren Raceravid Yelverton, MD  phenazopyridine (PYRIDIUM) 200 MG tablet Take 1 tablet (200 mg total) by mouth 3 (three) times daily as needed for pain. Patient not taking: Reported on 02/24/2016 02/17/16   Loren Raceravid Yelverton, MD   BP 134/97 mmHg  Pulse 88  Temp(Src) 97.9 F (36.6 C) (Oral)  Resp 20  SpO2 98%  LMP 02/24/2016   Physical Exam  Constitutional: She is oriented to person, place, and time. She appears well-developed and well-nourished. No distress.  HENT:  Head: Normocephalic and atraumatic.  Eyes: Conjunctivae  and EOM are normal. No scleral icterus.  Neck: Normal range of motion.  Pulmonary/Chest: Effort normal. No respiratory distress. She has no wheezes.  Respirations even and unlabored  Abdominal: Soft. She exhibits no distension. There is no tenderness. There is no rebound.  Soft, nontender abdomen. No masses or peritoneal signs.  Genitourinary: There is no rash, tenderness, lesion or injury on the right labia. There is no rash,  tenderness, lesion or injury on the left labia. Uterus is not tender. Cervix exhibits no motion tenderness. Right adnexum displays no mass, no tenderness and no fullness. Left adnexum displays no mass, no tenderness and no fullness. There is bleeding (small; c/w menses) in the vagina.  No CMT or adnexal TTP. Blood noted in vaginal vault c/w menses. No clots.  Musculoskeletal: Normal range of motion.  Neurological: She is alert and oriented to person, place, and time. She exhibits normal muscle tone. Coordination normal.  Skin: Skin is warm and dry. No rash noted. She is not diaphoretic. No erythema. No pallor.  Psychiatric: She has a normal mood and affect. Her behavior is normal.  Nursing note and vitals reviewed.   ED Course  Procedures (including critical care time) Labs Review Labs Reviewed  WET PREP, GENITAL - Abnormal; Notable for the following:    WBC, Wet Prep HPF POC MANY (*)    All other components within normal limits  COMPREHENSIVE METABOLIC PANEL - Abnormal; Notable for the following:    Glucose, Bld 101 (*)    All other components within normal limits  URINALYSIS, ROUTINE W REFLEX MICROSCOPIC (NOT AT [facility]) - Abnormal; Notable for the following:    Hgb urine dipstick LARGE (*)    All other components within normal limits  URINE MICROSCOPIC-ADD ON - Abnormal; Notable for the following:    Squamous Epithelial / LPF 0-5 (*)    Bacteria, UA RARE (*)    All other components within normal limits  URINE CULTURE  LIPASE, BLOOD  CBC  PREGNANCY, URINE  GC/CHLAMYDIA PROBE AMP ([place]) NOT AT [facility]    Imaging Review No results found.   I have personally reviewed and evaluated these images and lab results as part of my medical decision-making.   EKG Interpretation None      MDM   Final diagnoses:  Vaginal burning    35 year old female presents to the emergency department for evaluation of persistent vaginal burning. Patient is well and nontoxic appearing and in no  distress or evidence of discomfort. She is afebrile and without leukocytosis. Abdomen is soft and nontender. Patient also has no cervical motion tenderness or adnexal tenderness today.  Patient is currently being treated for pelvic inflammatory disease as well as bacterial vaginosis since she was seen one week ago for similar symptoms. Patient is currently on her menstrual cycle. Her laboratory workup is noncontributory. Doubt TOA or ovarian torsion. Low suspicion for emergent process. I recommended to the patient that she follow-up with her OB/GYN regarding her persistent symptoms. Tylenol and ibuprofen advised for discomfort and return precautions discussed. Patient discharged in satisfactory condition with no unaddressed concerns.   Filed Vitals:   02/24/16 2049  BP: 134/97  Pulse: 88  Temp: 97.9 F (36.6 C)  TempSrc: Oral  Resp: 20  SpO2: 98%     Antony Madura, PA-C 02/24/16 2329  Gerhard Munch, MD 02/24/16 [phone]

## 2016-02-25 LAB — GC/CHLAMYDIA PROBE AMP (~~LOC~~) NOT AT ARMC
CHLAMYDIA, DNA PROBE: NEGATIVE
Neisseria Gonorrhea: NEGATIVE

## 2016-02-27 LAB — URINE CULTURE

## 2017-01-27 ENCOUNTER — Encounter (HOSPITAL_COMMUNITY): Payer: Self-pay

## 2017-01-27 ENCOUNTER — Emergency Department (HOSPITAL_COMMUNITY)
Admission: EM | Admit: 2017-01-27 | Discharge: 2017-01-27 | Disposition: A | Discharge status: Home / Self Care | Payer: Medicaid Other | Attending: Emergency Medicine | Admitting: Emergency Medicine

## 2017-01-27 DIAGNOSIS — R1032 Left lower quadrant pain: Secondary | ICD-10-CM | POA: Diagnosis not present

## 2017-01-27 DIAGNOSIS — F17210 Nicotine dependence, cigarettes, uncomplicated: Secondary | ICD-10-CM | POA: Diagnosis not present

## 2017-01-27 DIAGNOSIS — R197 Diarrhea, unspecified: Secondary | ICD-10-CM | POA: Insufficient documentation

## 2017-01-27 DIAGNOSIS — R1084 Generalized abdominal pain: Secondary | ICD-10-CM | POA: Diagnosis present

## 2017-01-27 DIAGNOSIS — R109 Unspecified abdominal pain: Secondary | ICD-10-CM

## 2017-01-27 DIAGNOSIS — G8929 Other chronic pain: Secondary | ICD-10-CM | POA: Diagnosis not present

## 2017-01-27 HISTORY — DX: Calculus of gallbladder without cholecystitis without obstruction: K80.20

## 2017-01-27 LAB — PREGNANCY, URINE: Preg Test, Ur: NEGATIVE

## 2017-01-27 LAB — COMPREHENSIVE METABOLIC PANEL
ALK PHOS: 45 U/L (ref 38–126)
ALT: 13 U/L — ABNORMAL LOW (ref 14–54)
ANION GAP: 6 (ref 5–15)
AST: 14 U/L — ABNORMAL LOW (ref 15–41)
Albumin: 4.1 g/dL (ref 3.5–5.0)
BILIRUBIN TOTAL: 0.8 mg/dL (ref 0.3–1.2)
BUN: 10 mg/dL (ref 6–20)
CALCIUM: 9.4 mg/dL (ref 8.9–10.3)
CO2: 26 mmol/L (ref 22–32)
Chloride: 105 mmol/L (ref 101–111)
Creatinine, Ser: 0.72 mg/dL (ref 0.44–1.00)
GLUCOSE: 94 mg/dL (ref 65–99)
Potassium: 3.8 mmol/L (ref 3.5–5.1)
Sodium: 137 mmol/L (ref 135–145)
TOTAL PROTEIN: 7.1 g/dL (ref 6.5–8.1)

## 2017-01-27 LAB — CBC WITH DIFFERENTIAL/PLATELET
BASOS PCT: 0 %
Basophils Absolute: 0 10*3/uL (ref 0.0–0.1)
Eosinophils Absolute: 0 10*3/uL (ref 0.0–0.7)
Eosinophils Relative: 1 %
HEMATOCRIT: 39.1 % (ref 36.0–46.0)
Hemoglobin: 13.4 g/dL (ref 12.0–15.0)
Lymphocytes Relative: 21 %
Lymphs Abs: 1.4 10*3/uL (ref 0.7–4.0)
MCH: 31.7 pg (ref 26.0–34.0)
MCHC: 34.3 g/dL (ref 30.0–36.0)
MCV: 92.4 fL (ref 78.0–100.0)
MONO ABS: 0.7 10*3/uL (ref 0.1–1.0)
Monocytes Relative: 10 %
NEUTROS ABS: 4.4 10*3/uL (ref 1.7–7.7)
NEUTROS PCT: 68 %
Platelets: 314 10*3/uL (ref 150–400)
RBC: 4.23 MIL/uL (ref 3.87–5.11)
RDW: 12.6 % (ref 11.5–15.5)
WBC: 6.5 10*3/uL (ref 4.0–10.5)

## 2017-01-27 LAB — URINALYSIS, ROUTINE W REFLEX MICROSCOPIC
BILIRUBIN URINE: NEGATIVE
Bacteria, UA: NONE SEEN
GLUCOSE, UA: NEGATIVE mg/dL
KETONES UR: NEGATIVE mg/dL
LEUKOCYTES UA: NEGATIVE
NITRITE: NEGATIVE
PH: 7 (ref 5.0–8.0)
PROTEIN: NEGATIVE mg/dL
Specific Gravity, Urine: 1.012 (ref 1.005–1.030)
Squamous Epithelial / LPF: NONE SEEN

## 2017-01-27 LAB — LIPASE, BLOOD: Lipase: 20 U/L (ref 11–51)

## 2017-01-27 NOTE — Discharge Instructions (Signed)
Avoid greasy fried or fatty foods. Avoid milk or foods that contain milk such as cheese or ice cream all having diarrhea.Make sure that you drink at least six 8 ounce glasses of water or Gatorade each day in order to stay well-hydrated. You can take Tylenol every 4 hours as needed for pain. Keep your scheduled appointment for your CT scan of your abdomen neck week and follow up with your primary care doctor. Return for fever, uncontrolled vomiting, or if concern for any reason

## 2017-01-27 NOTE — ED Notes (Signed)
Pt states understanding of care given and follow up instructions.  Pt a/o, ambulated from ED with steady gait.  Pt questioning why we do not know what caused her pain, informed pt that we have ruled out any life threatening problems at the moment and feels safe discharging her home to the care of specialists.  Pt agrees to follow up with appointment

## 2017-01-27 NOTE — ED Triage Notes (Signed)
Pt reports that she had US done yesterday due to intermittent abdominal pain and diarrhea. Drs office called today to report that she has gallstones. Pt reports pain conts.  With at least 4-5 loose stools. CT scan scheduled for next week

## 2017-01-27 NOTE — ED Provider Notes (Addendum)
[facility] DEPT Provider Note   CSN: [id number] Arrival date & time: 01/27/17  1701     History   Chief Complaint Chief Complaint  Patient presents with  . Abdominal Pain    HPI Diana Holmes is a 36 y.o. female.Complains of abdominal pain constant for one month, diffuse, accompanied by multiple episodes diarrhea. No fever. No vomiting. Pain waxes and wanes. Pain presently is mild. Not made better by anything, made worse with eating. No vaginal discharge no urinary symptoms. No other associated symptoms no blood per rectum. Patient had ultrasound of gallbladder pain is outpatient yesterday which reportedly showed gallstones. She also has a CT scan of her abdomen ordered next week by her primary care physician  HPI  Past Medical History:  Diagnosis Date  . Anxiety   . Bronchitis   . Gall stones     There are no active problems to display for this patient.   Past Surgical History:  Procedure Laterality Date  . TUBAL LIGATION      OB History    Gravida Para Term Preterm AB Living   SAB TAB Ectopic Multiple Live Births                   Home Medications    Prior to Admission medications   Medication Sig Start Date End Date Taking? Authorizing Provider  doxycycline (VIBRAMYCIN) 100 MG capsule Take 1 capsule (100 mg total) by mouth 2 (two) times daily. One po bid x 7 days 02/17/16   Loren Racer, MD  fluconazole (DIFLUCAN) 150 MG tablet Take 150 mg by mouth every 30 (thirty) days.    Historical Provider, MD  metroNIDAZOLE (FLAGYL) 500 MG tablet Take 1 tablet (500 mg total) by mouth 2 (two) times daily. One po bid x 7 days 02/17/16   Loren Racer, MD  phenazopyridine (PYRIDIUM) 200 MG tablet Take 1 tablet (200 mg total) by mouth 3 (three) times daily as needed for pain. Patient not taking: Reported on 02/24/2016 02/17/16   Loren Racer, MD    Family History No family history on file.  Social History Social History  Substance Use Topics  .  Smoking status: Current Every Day Smoker    Packs/day: 0.05    Years: 16.00    Types: Cigarettes  . Smokeless tobacco: Never Used  . Alcohol use No     Allergies   Patient has no known allergies.   Review of Systems Review of Systems  Constitutional: Negative.   HENT: Negative.   Respiratory: Negative.   Cardiovascular: Negative.   Gastrointestinal: Positive for abdominal pain and diarrhea.  Musculoskeletal: Negative.   Skin: Negative.   Neurological: Negative.   Psychiatric/Behavioral: Negative.   All other systems reviewed and are negative.    Physical Exam Updated Vital Signs BP 111/88 (BP Location: Right Arm)   Pulse 84   Temp 99.2 F (37.3 C) (Temporal)   Resp 16   Ht  (1.575 m)   Wt 205 lb (93 kg)   LMP 12/29/2016 Comment: tubal ligation  SpO2 100%   BMI 37.49 kg/m   Physical Exam  Constitutional: She appears well-developed and well-nourished.  HENT:  Head: Normocephalic and atraumatic.  Eyes: Conjunctivae are normal. Pupils are equal, round, and reactive to light.  Neck: Neck supple. No tracheal deviation present. No thyromegaly present.  Cardiovascular: Normal rate and regular rhythm.   No murmur heard. Pulmonary/Chest: Effort normal and breath sounds  normal.  Abdominal: Soft. Bowel sounds are normal. She exhibits no distension and no mass. There is no tenderness. There is no rebound and no guarding. No hernia.  Obese, mildly tender at left lower quadrant. No tenderness at right upper quadrant.  Musculoskeletal: Normal range of motion. She exhibits no edema or tenderness.  Neurological: She is alert. Coordination normal.  Skin: Skin is warm and dry. No rash noted.  Psychiatric: She has a normal mood and affect.  Nursing note and vitals reviewed.    ED Treatments / Results  Labs (all labs ordered are listed, but only abnormal results are displayed) Labs Reviewed  URINALYSIS, ROUTINE W REFLEX MICROSCOPIC  PREGNANCY, URINE  COMPREHENSIVE  METABOLIC PANEL  CBC WITH DIFFERENTIAL/PLATELET  LIPASE, BLOOD   Declines pain medicine EKG  EKG Interpretation None       Radiology No results found.  Procedures Procedures (including critical care time)  Medications Ordered in ED Medications - No data to display  Results for orders placed or performed during the hospital encounter of 01/27/17  Urinalysis, Routine w reflex microscopic  Result Value Ref Range   Color, Urine YELLOW YELLOW   APPearance CLEAR CLEAR   Specific Gravity, Urine 1.012 1.005 - 1.030   pH 7.0 5.0 - 8.0   Glucose, UA NEGATIVE NEGATIVE mg/dL   Hgb urine dipstick SMALL (A) NEGATIVE   Bilirubin Urine NEGATIVE NEGATIVE   Ketones, ur NEGATIVE NEGATIVE mg/dL   Protein, ur NEGATIVE NEGATIVE mg/dL   Nitrite NEGATIVE NEGATIVE   Leukocytes, UA NEGATIVE NEGATIVE   RBC / HPF 0-5 0 - 5 RBC/hpf   WBC, UA 0-5 0 - 5 WBC/hpf   Bacteria, UA NONE SEEN NONE SEEN   Squamous Epithelial / LPF NONE SEEN NONE SEEN  Pregnancy, urine  Result Value Ref Range   Preg Test, Ur NEGATIVE NEGATIVE  Comprehensive metabolic panel  Result Value Ref Range   Sodium 137 135 - 145 mmol/L   Potassium 3.8 3.5 - 5.1 mmol/L   Chloride 105 101 - 111 mmol/L   CO2 26 22 - 32 mmol/L   Glucose, Bld 94 65 - 99 mg/dL   BUN 10 6 - 20 mg/dL   Creatinine, Ser 1.61 0.44 - 1.00 mg/dL   Calcium 9.4 8.9 - 09.6 mg/dL   Total Protein 7.1 6.5 - 8.1 g/dL   Albumin 4.1 3.5 - 5.0 g/dL   AST 14 (L) 15 - 41 U/L   ALT 13 (L) 14 - 54 U/L   Alkaline Phosphatase 45 38 - 126 U/L   Total Bilirubin 0.8 0.3 - 1.2 mg/dL   GFR calc non Af Amer >60 >60 mL/min   GFR calc Af Amer >60 >60 mL/min   Anion gap 6 5 - 15  CBC with Differential/Platelet  Result Value Ref Range   WBC 6.5 4.0 - 10.5 K/uL   RBC 4.23 3.87 - 5.11 MIL/uL   Hemoglobin 13.4 12.0 - 15.0 g/dL   HCT 04.5 40.9 - 81.1 %   MCV 92.4 78.0 - 100.0 fL   MCH 31.7 26.0 - 34.0 pg   MCHC 34.3 30.0 - 36.0 g/dL   RDW 91.4 78.2 - 95.6 %   Platelets  314 150 - 400 K/uL   Neutrophils Relative % 68 %   Neutro Abs 4.4 1.7 - 7.7 K/uL   Lymphocytes Relative 21 %   Lymphs Abs 1.4 0.7 - 4.0 K/uL   Monocytes Relative 10 %   Monocytes Absolute 0.7 0.1 - 1.0 K/uL  Eosinophils Relative 1 %   Eosinophils Absolute 0.0 0.0 - 0.7 K/uL   Basophils Relative 0 %   Basophils Absolute 0.0 0.0 - 0.1 K/uL  Lipase, blood  Result Value Ref Range   Lipase 20 11 - 51 U/L   No results found. Initial Impression / Assessment and Plan / ED Course  I have reviewed the triage vital signs and the nursing notes.  Pertinent labs & imaging results that were available during my care of the patient were reviewed by me and considered in my medical decision making (see chart for details).    7:30 PM patient resting comfortably. He continues to decline pain medicine.   Doubt acute cholecystitis. No fever no right upper quadrant pain or tenderness. Abdominal pain is chronic also has chronic diarrhea. She denies recent travel or antibiotics. Plan Imodium for diarrhea, avoid dairy.avoid fatty foods Encourage oral hydration. Tylenol for pain. Follow-up with PMD. Feel it safe for her to get outpatient CT scan of her abdomen next week Final Clinical Impressions(s) / ED Diagnoses  Dx #1 chronic abdominal pain #2 diarrhea Final diagnoses:  None    New Prescriptions New Prescriptions   No medications on file     Doug Sou, MD 01/27/17 1610    Doug Sou, MD 01/27/17 2203

## 2017-11-28 ENCOUNTER — Other Ambulatory Visit: Payer: Self-pay

## 2017-11-28 ENCOUNTER — Ambulatory Visit (INDEPENDENT_AMBULATORY_CARE_PROVIDER_SITE_OTHER): Payer: Medicaid Other | Admitting: Family Medicine

## 2017-11-28 ENCOUNTER — Encounter: Payer: Self-pay | Admitting: Family Medicine

## 2017-11-28 ENCOUNTER — Other Ambulatory Visit (HOSPITAL_COMMUNITY)
Admission: RE | Admit: 2017-11-28 | Discharge: 2017-11-28 | Disposition: A | Discharge status: Home / Self Care | Payer: Medicaid Other | Source: Ambulatory Visit | Attending: Family Medicine | Admitting: Family Medicine

## 2017-11-28 VITALS — HR 91 | Resp 16 | Ht 63.0 in | Wt 203.2 lb

## 2017-11-28 DIAGNOSIS — B373 Candidiasis of vulva and vagina: Secondary | ICD-10-CM

## 2017-11-28 DIAGNOSIS — Z23 Encounter for immunization: Secondary | ICD-10-CM

## 2017-11-28 DIAGNOSIS — Z113 Encounter for screening for infections with a predominantly sexual mode of transmission: Secondary | ICD-10-CM | POA: Diagnosis not present

## 2017-11-28 DIAGNOSIS — R739 Hyperglycemia, unspecified: Secondary | ICD-10-CM

## 2017-11-28 DIAGNOSIS — N898 Other specified noninflammatory disorders of vagina: Secondary | ICD-10-CM | POA: Diagnosis not present

## 2017-11-28 DIAGNOSIS — B3731 Acute candidiasis of vulva and vagina: Secondary | ICD-10-CM

## 2017-11-28 DIAGNOSIS — Z8632 Personal history of gestational diabetes: Secondary | ICD-10-CM | POA: Insufficient documentation

## 2017-11-28 MED ORDER — FLUCONAZOLE 150 MG PO TABS: ORAL_TABLET | ORAL | 0 refills | Status: DC

## 2017-11-28 MED ORDER — TERCONAZOLE 0.4 % VA CREA: 1.0000 | TOPICAL_CREAM | Freq: Every day | VAGINAL | 0 refills | Status: DC

## 2017-11-28 NOTE — Progress Notes (Signed)
Patient ID: Diana Holmes, female    DOB: 1980/11/02, 37 y.o.   MRN: [id number]  Chief Complaint  Patient presents with  . Vaginitis    patient used to take medication monthly to treat this, has been without for 4 months.     Allergies Patient has no known allergies.  Subjective:   Diana Holmes is a 37 y.o. female who presents to [facility] today.  HPI Diana Holmes presents today as a new patient visit to establish care.  She reports that she is previously been followed by Dr. Jerrel Ivory transferring her care to our office.  She reports that her main concern is her history of chronic vaginal yeast infections.  She reports that she was previously treated with Diflucan 1 pill each month by her primary care physician.  She reports that since he passed away that she has not received the Diflucan for several months.  She reports that she has been bothered by this vaginal discharge and itching for the past several months.  She reports that she has a white, cottage cheese vaginal discharge.  Reports vaginal itching.  She reports that she does use tampons.  She does not douche or use of feminine hygiene products.  Denies a history of any sexually transmitted infections.  Is not currently sexually active.  Reports that the vaginal discharge can be very profuse and thick.  Currently on menses.  Patient does report that she has had a history of gestational diabetes 8 years ago and is not been checked since that time.  She denies any other skin rashes.  She denies any blisters or lesions in her vaginal or perineal area.  Patient does smoke cigarettes on a daily basis.  She is not interested in quitting smoking.  She denies any alcohol or drug use.  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  She reports that she never had problems with yeast infections until she had a tubal ligation.  She believes that getting the tubal ligation has predisposed her to having these chronic yeast  infections.  She reports that she has not been checked for any other sexually transmitted diseases.  She does not want a Pap, pelvic, or vaginal exam today due to the fact that she is on her menses.  Patient reports that her periods are monthly and last 1 week.  She denies any problems with her menses.  She does not reports that her symptoms of vaginal itching get worse before her menses.  She denies any lower abdominal pain.  No dysuria.  Denies any fevers, chills, weight loss, weight gain, or night sweats.    Past Medical History:  Diagnosis Date  . Anxiety   . Bronchitis   . Gall stones   . Yeast infection     Past Surgical History:  Procedure Laterality Date  . TUBAL LIGATION      Family History  Problem Relation Age of Onset  . Kidney Stones Father      Social History   Socioeconomic History  . Marital status: Single    Spouse name: None  . Number of children: None  . Years of education: None  . Highest education level: None  Social Needs  . Financial resource strain: None  . Food insecurity - worry: None  . Food insecurity - inability: None  . Transportation needs - medical: None  . Transportation needs - non-medical: None  Occupational History  . None  Tobacco Use  .  Smoking status: Current Every Day Smoker    Packs/day: 0.05    Years: 16.00    Pack years: 0.80    Types: Cigarettes  . Smokeless tobacco: Never Used  Substance and Sexual Activity  . Alcohol use: No  . Drug use: No  . Sexual activity: Not Currently    Birth control/protection: Surgical  Other Topics Concern  . None  Social History Narrative   Grew up in [place], [place].   Stay at home mom.   Four children. 36, 52, 70, 79 year old.    Single, not dating.    Eats all foods.   Wears seatbelt.    Enjoys time with friends.     Review of Systems  Constitutional: Negative for activity change, appetite change and fever.  HENT: Negative for mouth sores, trouble swallowing and voice change.     Eyes: Negative for visual disturbance.  Respiratory: Negative for cough, chest tightness and shortness of breath.   Cardiovascular: Negative for chest pain, palpitations and leg swelling.  Gastrointestinal: Negative for abdominal pain, anal bleeding, blood in stool, diarrhea, nausea, rectal pain and vomiting.  Endocrine: Negative for polydipsia, polyphagia and polyuria.  Genitourinary: Positive for vaginal discharge. Negative for decreased urine volume, dysuria, frequency, genital sores, hematuria, menstrual problem, urgency and vaginal pain.       .  Skin: Negative for rash.  Neurological: Negative for dizziness, syncope and light-headedness.  Hematological: Negative for adenopathy.  Psychiatric/Behavioral: Negative for agitation, hallucinations, sleep disturbance and suicidal ideas. The patient is not nervous/anxious.      Objective:   Pulse 91   Resp 16   Ht 5\' 3"  (1.6 m)   Wt 203 lb 4 oz (92.2 kg)   LMP 11/27/2017   SpO2 97%   BMI 36.00 kg/m   Physical Exam  Constitutional: She is oriented to person, place, and time.  HENT:  Head: Normocephalic and atraumatic.  Moist mucous membranes.  No lesions in oral cavity.  No evidence of thrush.  Eyes: EOM are normal. Pupils are equal, round, and reactive to light.  Neck: Normal range of motion. Neck supple. No thyromegaly present.  Cardiovascular: Normal rate and regular rhythm.  Pulmonary/Chest: Effort normal and breath sounds normal.  Abdominal: Soft. Bowel sounds are normal.  Obese, nondistended nontender.  Genitourinary:  Genitourinary Comments: Exam deferred by patient.  Musculoskeletal: Normal range of motion.  Neurological: She is alert and oriented to person, place, and time.  Skin: Skin is warm. Capillary refill takes less than 2 seconds.  Psychiatric: She has a normal mood and affect. Her behavior is normal. Judgment and thought content normal.     Assessment and Plan  1. Yeast infection involving the vagina and  surrounding area Given patient's history and chronic symptoms we will go ahead and treat her for candidal vaginitis at this time.  Will use intravaginal preparation and also treat her with Diflucan as directed below.  Discussed with patient that her history of gestational diabetes and recurrent yeast infections do concern me for the possibility of elevated blood sugars.  We discussed her diet.  Recommended yogurt with live cultures on a daily basis.  It is recommended that she decrease her sugar intake, specifically her sweet tea intake. - terconazole (TERAZOL 7) 0.4 % vaginal cream; Place 1 applicator vaginally at bedtime.  Dispense: 45 g; Refill: 0 - fluconazole (DIFLUCAN) 150 MG tablet; Take one pill every three days.  Dispense: 3 tablet; Refill: 0  2. Hyperglycemia - Hemoglobin A1c -  Basic metabolic panel  3. History of gestational diabetes Check for diabetes today.  Diet, exercise, and weight loss discussed with patient.  We did talk about her BMI and the fact that she is obese.  This could be contributing to her recurrent yeast infections also. - Hemoglobin A1c - Basic metabolic panel  4. Immunization due Vaccinations administered today - Flu Vaccine QUAD 6+ mos PF IM (Fluarix Quad PF) - Tdap vaccine greater than or equal to 7yo IM  5. Vaginal discharge Pelvic exam and swabs deferred by patient today.  However will check urine for infection at this time.  Patient will follow-up in 2-3 weeks for Pap, pelvic, and complete physical examination. - Urine cytology ancillary only  6. Screen for STD (sexually transmitted disease) Safe sex recommended. - HIV antibody - RPR - Hepatitis panel, acute  Today we did briefly discuss tobacco use.  Tobacco cessation was recommended.  Patient is not ready to quit at this time but she will consider and we will discuss at the next visit.  Patient was asked to bring in a diet log and keep track of her exercise.  To start out, 20 minutes a day was  recommended.  We did discuss that it would be a goal to lose 3 pounds prior to her next visit, which would put her below 200 pounds. Return in about 3 weeks (around 12/19/2017) for Complete physical exam. Aliene Beamsachel Elaine Roanhorse, MD 11/28/2017

## 2017-11-28 NOTE — Patient Instructions (Signed)
Cetaphil regular one. It should not be ANTI-Bacterial. Gentle skin cleaner, bar;   Yogurt, live cultures  Cut out the SWEET TEA.

## 2017-11-29 LAB — BASIC METABOLIC PANEL
BUN: 10 mg/dL (ref 7–25)
CHLORIDE: 107 mmol/L (ref 98–110)
CO2: 30 mmol/L (ref 20–32)
CREATININE: 0.78 mg/dL (ref 0.50–1.10)
Calcium: 9.7 mg/dL (ref 8.6–10.2)
GLUCOSE: 81 mg/dL (ref 65–139)
Potassium: 4.1 mmol/L (ref 3.5–5.3)
SODIUM: 143 mmol/L (ref 135–146)

## 2017-11-29 LAB — HEMOGLOBIN A1C
EAG (MMOL/L): 5.7 (calc)
Hgb A1c MFr Bld: 5.2 % of total Hgb (ref <5.7)
MEAN PLASMA GLUCOSE: 103 (calc)

## 2017-11-29 LAB — HEPATITIS PANEL, ACUTE
HEP C AB: NONREACTIVE
Hep A IgM: NONREACTIVE
Hep B C IgM: NONREACTIVE
Hepatitis B Surface Ag: NONREACTIVE
SIGNAL TO CUT-OFF: 0.01 (ref <1.00)

## 2017-11-29 LAB — RPR: RPR Ser Ql: NONREACTIVE

## 2017-11-29 LAB — HIV ANTIBODY (ROUTINE TESTING W REFLEX): HIV 1&2 Ab, 4th Generation: NONREACTIVE

## 2017-11-30 ENCOUNTER — Telehealth: Payer: Self-pay | Admitting: Family Medicine

## 2017-11-30 LAB — URINE CYTOLOGY ANCILLARY ONLY
CHLAMYDIA, DNA PROBE: NEGATIVE
Neisseria Gonorrhea: NEGATIVE
Trichomonas: POSITIVE — AB

## 2017-11-30 MED ORDER — METRONIDAZOLE 500 MG PO TABS: 500.0000 mg | ORAL_TABLET | Freq: Two times a day (BID) | ORAL | 0 refills | Status: DC

## 2017-11-30 NOTE — Telephone Encounter (Signed)
Please call patient and advised that her lab tests revealed that she was not diabetic.  Her electrolytes and kidney function were within normal limits.  Her testing for HIV, syphilis, and hepatitis were negative.  Her testing did reveal evidence of trichomonas.  This is considered a sexually transmitted infection and needs to be treated.  She will need to take the medication as directed.  She should abstain from intercourse until she has been treated.  We will need to recheck her in 3 months to make sure this is completely resolved.  Advised her that she may not drink alcohol while taking this medication or within 3 days of completing the medication or it could make her very sick and because a serious reaction.  The medication is called metronidazole.  She will take 1 pill twice a day for 7 days.  Please advise her to keep her scheduled follow-up.  I have not received the rest of the testing back that checks for yeast or bacterial vaginosis.  I did receive the testing for gonorrhea and chlamydia and these 2 tests were negative.  Please let me know if she has any additional questions.

## 2017-12-01 NOTE — Telephone Encounter (Signed)
Patient informed of message below, verbalized understanding.  

## 2017-12-02 LAB — URINE CYTOLOGY ANCILLARY ONLY
BACTERIAL VAGINITIS: NEGATIVE
CANDIDA VAGINITIS: NEGATIVE

## 2018-03-30 ENCOUNTER — Encounter: Payer: Self-pay | Admitting: Family Medicine

## 2018-10-02 ENCOUNTER — Emergency Department (HOSPITAL_COMMUNITY): Payer: Medicaid Other

## 2018-10-02 ENCOUNTER — Emergency Department (HOSPITAL_COMMUNITY)
Admission: EM | Admit: 2018-10-02 | Discharge: 2018-10-02 | Disposition: A | Discharge status: Home / Self Care | Payer: Medicaid Other | Attending: Emergency Medicine | Admitting: Emergency Medicine

## 2018-10-02 ENCOUNTER — Other Ambulatory Visit: Payer: Self-pay

## 2018-10-02 ENCOUNTER — Encounter (HOSPITAL_COMMUNITY): Payer: Self-pay | Admitting: Emergency Medicine

## 2018-10-02 DIAGNOSIS — J189 Pneumonia, unspecified organism: Secondary | ICD-10-CM | POA: Diagnosis not present

## 2018-10-02 DIAGNOSIS — R0602 Shortness of breath: Secondary | ICD-10-CM | POA: Diagnosis not present

## 2018-10-02 DIAGNOSIS — Z79899 Other long term (current) drug therapy: Secondary | ICD-10-CM | POA: Insufficient documentation

## 2018-10-02 DIAGNOSIS — F17210 Nicotine dependence, cigarettes, uncomplicated: Secondary | ICD-10-CM | POA: Insufficient documentation

## 2018-10-02 DIAGNOSIS — R05 Cough: Secondary | ICD-10-CM | POA: Diagnosis not present

## 2018-10-02 LAB — POC URINE PREG, ED: Preg Test, Ur: NEGATIVE

## 2018-10-02 MED ORDER — CEFTRIAXONE SODIUM 1 G IJ SOLR
1.0000 g | Freq: Once | INTRAMUSCULAR | Status: AC
  Administered 2018-10-02: 1 g via INTRAMUSCULAR
  Filled 2018-10-02: qty 10

## 2018-10-02 MED ORDER — BENZONATATE 100 MG PO CAPS
200.0000 mg | ORAL_CAPSULE | Freq: Once | ORAL | Status: AC
  Administered 2018-10-02: 200 mg via ORAL
  Filled 2018-10-02: qty 2

## 2018-10-02 MED ORDER — LIDOCAINE HCL (PF) 1 % IJ SOLN
INTRAMUSCULAR | Status: AC
  Administered 2018-10-02: 22:00:00
  Filled 2018-10-02: qty 2

## 2018-10-02 MED ORDER — BENZONATATE 100 MG PO CAPS: 200.0000 mg | ORAL_CAPSULE | Freq: Three times a day (TID) | ORAL | 0 refills | Status: DC | PRN

## 2018-10-02 MED ORDER — IBUPROFEN 800 MG PO TABS
800.0000 mg | ORAL_TABLET | Freq: Once | ORAL | Status: AC
  Administered 2018-10-02: 800 mg via ORAL
  Filled 2018-10-02: qty 1

## 2018-10-02 MED ORDER — AZITHROMYCIN 250 MG PO TABS: ORAL_TABLET | ORAL | 0 refills | Status: DC

## 2018-10-02 NOTE — Discharge Instructions (Addendum)
Rest and make sure you are drinking plenty of fluids.  Get your zithromax started tomorrow morning.  Continue taking tylenol or ibuprofen for fever reduction.  Return here for any worsened shortness of breath, weakness or increasing fever or any new complaints.

## 2018-10-02 NOTE — ED Provider Notes (Signed)
[facility] EMERGENCY DEPARTMENT Provider Note   CSN: [id number] Arrival date & time: 10/02/18  1934     History   Chief Complaint Chief Complaint  Patient presents with  . Cough    HPI Diana Holmes is a 37 y.o. female whose daughter is just getting over pneumonia presenting with a 2 day history of cough and congestion along with subjective fever (100.5 here) generalized body aches and fatigue.  Her cough has been nonproductive. She denies wheezing, shortness of breath, but does endorse lower sternum level pain with cough, described as burning in character. She has taken tylenol and ibuprofen with temporary improvement in symptoms. She has had no n/v/d, denies dysuria, headache, abdominal pain.  HPI  Past Medical History:  Diagnosis Date  . Anxiety   . Bronchitis   . Gall stones   . Yeast infection     There are no active problems to display for this patient.   Past Surgical History:  Procedure Laterality Date  . TUBAL LIGATION       OB History    Gravida  4   Para  4   Term  4   Preterm      AB      Living  4     SAB      TAB      Ectopic      Multiple      Live Births               Home Medications    Prior to Admission medications   Medication Sig Start Date End Date Taking? Authorizing Provider  azithromycin (ZITHROMAX Z-PAK) 250 MG tablet Take 2 tablets by mouth on day one followed by one tablet daily for 4 days. 10/02/18   Burgess Amor, PA-C  benzonatate (TESSALON) 100 MG capsule Take 2 capsules (200 mg total) by mouth 3 (three) times daily as needed for cough. 10/02/18   Burgess Amor, PA-C  doxycycline (VIBRAMYCIN) 100 MG capsule Take 1 capsule (100 mg total) by mouth 2 (two) times daily. One po bid x 7 days Patient not taking: Reported on 11/28/2017 02/17/16   Loren Racer, MD  fluconazole (DIFLUCAN) 150 MG tablet Take 150 mg by mouth every 30 (thirty) days.    [provider]  fluconazole (DIFLUCAN) 150 MG tablet Take  one pill every three days. 11/28/17   Aliene Beams, MD  metroNIDAZOLE (FLAGYL) 500 MG tablet Take 1 tablet (500 mg total) by mouth 2 (two) times daily. One po bid x 7 days 11/30/17   Aliene Beams, MD  phenazopyridine (PYRIDIUM) 200 MG tablet Take 1 tablet (200 mg total) by mouth 3 (three) times daily as needed for pain. Patient not taking: Reported on 02/24/2016 02/17/16   Loren Racer, MD  terconazole (TERAZOL 7) 0.4 % vaginal cream Place 1 applicator vaginally at bedtime. 11/28/17   Aliene Beams, MD    Family History Family History  Problem Relation Age of Onset  . Kidney Stones Father     Social History Social History   Tobacco Use  . Smoking status: Current Every Day Smoker    Packs/day: 0.05    Years: 16.00    Pack years: 0.80    Types: Cigarettes  . Smokeless tobacco: Never Used  Substance Use Topics  . Alcohol use: No  . Drug use: No     Allergies   Patient has no known allergies.   Review of Systems Review of Systems  Constitutional: Positive for  chills and fever.  HENT: Positive for congestion. Negative for ear pain, rhinorrhea, sinus pressure, sore throat, trouble swallowing and voice change.   Eyes: Negative for discharge.  Respiratory: Positive for cough. Negative for shortness of breath, wheezing and stridor.   Cardiovascular: Negative for chest pain.  Gastrointestinal: Negative for abdominal pain, diarrhea, nausea and vomiting.  Genitourinary: Negative.   Musculoskeletal: Positive for myalgias. Negative for neck pain.  Neurological: Negative for headaches.     Physical Exam Updated Vital Signs BP (!) 146/82   Pulse (!) 108   Temp (!) 102.3 F (39.1 C) (Oral)   Resp 18   Ht 5\' 4"  (1.626 m)   Wt 90.7 kg   LMP 10/02/2018   SpO2 100%   BMI 34.33 kg/m   Physical Exam  Constitutional: She is oriented to person, place, and time. She appears well-developed and well-nourished.  Low grade fever 100.5  HENT:  Head: Normocephalic and atraumatic.    Right Ear: Tympanic membrane and ear canal normal.  Left Ear: Tympanic membrane and ear canal normal.  Nose: Rhinorrhea present. No mucosal edema.  Mouth/Throat: Uvula is midline, oropharynx is clear and moist and mucous membranes are normal. No oropharyngeal exudate, posterior oropharyngeal edema, posterior oropharyngeal erythema or tonsillar abscesses.  Eyes: Conjunctivae are normal.  Neck: Normal range of motion.  Cardiovascular: Normal rate and intact distal pulses. Exam reveals no friction rub.  No murmur heard. Borderline tachy.  Pulmonary/Chest: Effort normal. No respiratory distress. She has no wheezes. She has no rales. She exhibits tenderness.  ttp lower sternum.   Frequent dry cough.     Abdominal: Soft. There is no tenderness.  Musculoskeletal: Normal range of motion.  Lymphadenopathy:    She has no cervical adenopathy.  Neurological: She is alert and oriented to person, place, and time.  Skin: Skin is warm and dry. No rash noted.  Psychiatric: She has a normal mood and affect.  Nursing note and vitals reviewed.    ED Treatments / Results  Labs (all labs ordered are listed, but only abnormal results are displayed) Labs Reviewed  POC URINE PREG, ED    EKG None  Radiology Dg Chest 2 View  Result Date: 10/02/2018 CLINICAL DATA:  Cough and shortness of breath EXAM: CHEST - 2 VIEW COMPARISON:  December 08, 2013 FINDINGS: There is airspace consolidation throughout portions of the lingula consistent with pneumonia. Lungs elsewhere are clear. Heart size and pulmonary vascularity are normal. No adenopathy. No bone lesions. IMPRESSION: Airspace consolidation consistent pneumonia throughout portions of the lingula. Lungs elsewhere clear. No adenopathy evident. Electronically Signed   By: Bretta BangWilliam  Woodruff III M.D.   On: 10/02/2018 21:38    Procedures Procedures (including critical care time)  Medications Ordered in ED Medications  benzonatate (TESSALON) capsule 200  mg (200 mg Oral Given 10/02/18 2119)  cefTRIAXone (ROCEPHIN) injection 1 g (1 g Intramuscular Given 10/02/18 2156)  lidocaine (PF) (XYLOCAINE) 1 % injection (  Given 10/02/18 2156)  ibuprofen (ADVIL,MOTRIN) tablet 800 mg (800 mg Oral Given 10/02/18 2205)     Initial Impression / Assessment and Plan / ED Course  I have reviewed the triage vital signs and the nursing notes.  Pertinent labs & imaging results that were available during my care of the patient were reviewed by me and considered in my medical decision making (see chart for details).     Imaging reviewed and discussed with pt.  She was given rocephin. Zithromax, and tessalon prescribed. Discussed rest, tylenol/motrin for fever reduction.  Discussed return precautions. Low risk patient acceptable for OP tx.   Final Clinical Impressions(s) / ED Diagnoses   Final diagnoses:  Community acquired pneumonia of left lung, unspecified part of lung    ED Discharge Orders         Ordered    azithromycin (ZITHROMAX Z-PAK) 250 MG tablet     10/02/18 2159    benzonatate (TESSALON) 100 MG capsule  3 times daily PRN     10/02/18 2159           Burgess Amor, PA-C 10/02/18 2219    Long, Arlyss Repress, MD 10/03/18 [phone]

## 2018-10-02 NOTE — ED Triage Notes (Signed)
Patient states cough and congestion since Sunday night. Patient also has a fever of 100.5 oral. Patient complains of body aches.

## 2019-01-12 ENCOUNTER — Emergency Department (HOSPITAL_COMMUNITY)
Admission: EM | Admit: 2019-01-12 | Discharge: 2019-01-12 | Disposition: A | Discharge status: Home / Self Care | Payer: Medicaid Other | Attending: Emergency Medicine | Admitting: Emergency Medicine

## 2019-01-12 ENCOUNTER — Encounter (HOSPITAL_COMMUNITY): Payer: Self-pay | Admitting: Emergency Medicine

## 2019-01-12 ENCOUNTER — Other Ambulatory Visit: Payer: Self-pay

## 2019-01-12 ENCOUNTER — Emergency Department (HOSPITAL_COMMUNITY): Payer: Medicaid Other

## 2019-01-12 DIAGNOSIS — K8080 Other cholelithiasis without obstruction: Secondary | ICD-10-CM | POA: Insufficient documentation

## 2019-01-12 DIAGNOSIS — R1010 Upper abdominal pain, unspecified: Secondary | ICD-10-CM | POA: Diagnosis present

## 2019-01-12 DIAGNOSIS — R1011 Right upper quadrant pain: Secondary | ICD-10-CM | POA: Diagnosis not present

## 2019-01-12 DIAGNOSIS — R1013 Epigastric pain: Secondary | ICD-10-CM | POA: Diagnosis not present

## 2019-01-12 DIAGNOSIS — E876 Hypokalemia: Secondary | ICD-10-CM | POA: Insufficient documentation

## 2019-01-12 DIAGNOSIS — Z79899 Other long term (current) drug therapy: Secondary | ICD-10-CM | POA: Diagnosis not present

## 2019-01-12 DIAGNOSIS — K8020 Calculus of gallbladder without cholecystitis without obstruction: Secondary | ICD-10-CM | POA: Diagnosis not present

## 2019-01-12 DIAGNOSIS — F17210 Nicotine dependence, cigarettes, uncomplicated: Secondary | ICD-10-CM | POA: Diagnosis not present

## 2019-01-12 LAB — CBC WITH DIFFERENTIAL/PLATELET
Abs Immature Granulocytes: 0.02 10*3/uL (ref 0.00–0.07)
BASOS PCT: 1 %
Basophils Absolute: 0 10*3/uL (ref 0.0–0.1)
Eosinophils Absolute: 0 10*3/uL (ref 0.0–0.5)
Eosinophils Relative: 0 %
HCT: 43.2 % (ref 36.0–46.0)
Hemoglobin: 14.4 g/dL (ref 12.0–15.0)
Immature Granulocytes: 0 %
Lymphocytes Relative: 16 %
Lymphs Abs: 1.1 10*3/uL (ref 0.7–4.0)
MCH: 30.5 pg (ref 26.0–34.0)
MCHC: 33.3 g/dL (ref 30.0–36.0)
MCV: 91.5 fL (ref 80.0–100.0)
Monocytes Absolute: 0.6 10*3/uL (ref 0.1–1.0)
Monocytes Relative: 9 %
NRBC: 0 % (ref 0.0–0.2)
Neutro Abs: 5 10*3/uL (ref 1.7–7.7)
Neutrophils Relative %: 74 %
Platelets: 382 10*3/uL (ref 150–400)
RBC: 4.72 MIL/uL (ref 3.87–5.11)
RDW: 12.7 % (ref 11.5–15.5)
WBC: 6.8 10*3/uL (ref 4.0–10.5)

## 2019-01-12 LAB — PREGNANCY, URINE: Preg Test, Ur: NEGATIVE

## 2019-01-12 LAB — COMPREHENSIVE METABOLIC PANEL
ALT: 13 U/L (ref 0–44)
AST: 15 U/L (ref 15–41)
Albumin: 4.6 g/dL (ref 3.5–5.0)
Alkaline Phosphatase: 55 U/L (ref 38–126)
Anion gap: 10 (ref 5–15)
BUN: 12 mg/dL (ref 6–20)
CALCIUM: 9.5 mg/dL (ref 8.9–10.3)
CO2: 24 mmol/L (ref 22–32)
Chloride: 105 mmol/L (ref 98–111)
Creatinine, Ser: 0.79 mg/dL (ref 0.44–1.00)
GFR calc Af Amer: >60 mL/min (ref >60)
GFR calc non Af Amer: >60 mL/min (ref >60)
Glucose, Bld: 119 mg/dL — ABNORMAL HIGH (ref 70–99)
Potassium: 3.1 mmol/L — ABNORMAL LOW (ref 3.5–5.1)
Sodium: 139 mmol/L (ref 135–145)
Total Bilirubin: 1.3 mg/dL — ABNORMAL HIGH (ref 0.3–1.2)
Total Protein: 7.8 g/dL (ref 6.5–8.1)

## 2019-01-12 LAB — URINALYSIS, ROUTINE W REFLEX MICROSCOPIC
Bacteria, UA: NONE SEEN
Bilirubin Urine: NEGATIVE
Glucose, UA: NEGATIVE mg/dL
Ketones, ur: 20 mg/dL — AB
Leukocytes,Ua: NEGATIVE
Nitrite: NEGATIVE
Protein, ur: NEGATIVE mg/dL
SPECIFIC GRAVITY, URINE: 1.015 (ref 1.005–1.030)
pH: 7 (ref 5.0–8.0)

## 2019-01-12 LAB — LIPASE, BLOOD: LIPASE: 25 U/L (ref 11–51)

## 2019-01-12 MED ORDER — SODIUM CHLORIDE 0.9 % IV BOLUS
1000.0000 mL | Freq: Once | INTRAVENOUS | Status: AC
  Administered 2019-01-12: 1000 mL via INTRAVENOUS

## 2019-01-12 MED ORDER — IBUPROFEN 600 MG PO TABS: 600.0000 mg | ORAL_TABLET | Freq: Four times a day (QID) | ORAL | 0 refills | Status: DC | PRN

## 2019-01-12 MED ORDER — MORPHINE SULFATE (PF) 4 MG/ML IV SOLN
4.0000 mg | Freq: Once | INTRAVENOUS | Status: AC
  Administered 2019-01-12: 4 mg via INTRAVENOUS
  Filled 2019-01-12: qty 1

## 2019-01-12 MED ORDER — POTASSIUM CHLORIDE CRYS ER 20 MEQ PO TBCR: 20.0000 meq | EXTENDED_RELEASE_TABLET | Freq: Every day | ORAL | 0 refills | Status: DC

## 2019-01-12 MED ORDER — ONDANSETRON HCL 4 MG PO TABS: 4.0000 mg | ORAL_TABLET | Freq: Four times a day (QID) | ORAL | 0 refills | Status: DC

## 2019-01-12 MED ORDER — POTASSIUM CHLORIDE CRYS ER 20 MEQ PO TBCR
40.0000 meq | EXTENDED_RELEASE_TABLET | Freq: Once | ORAL | Status: AC
  Administered 2019-01-12: 40 meq via ORAL
  Filled 2019-01-12: qty 2

## 2019-01-12 MED ORDER — DOXYCYCLINE HYCLATE 100 MG PO CAPS: 100.0000 mg | ORAL_CAPSULE | Freq: Two times a day (BID) | ORAL | 0 refills | Status: DC

## 2019-01-12 MED ORDER — ONDANSETRON HCL 4 MG/2ML IJ SOLN
4.0000 mg | Freq: Once | INTRAMUSCULAR | Status: AC
  Administered 2019-01-12: 4 mg via INTRAVENOUS
  Filled 2019-01-12: qty 2

## 2019-01-12 NOTE — Discharge Instructions (Signed)
Your pain is likely due to gallstone.  Please eat bland diet and follow up closely with general surgery for further management of your condition.  Return if you have any concerns.

## 2019-01-12 NOTE — ED Provider Notes (Signed)
[facility] EMERGENCY DEPARTMENT Provider Note   CSN: [id number] Arrival date & time: 01/12/19  4562    History   Chief Complaint No chief complaint on file.   HPI Diana Holmes is a 38 y.o. female.     The history is provided by the patient and medical records. No language interpreter was used.  Abdominal Pain     38 year old female with history of cholelithiasis presenting with Abdominal pain.  Patient report for the past 3 to 4 days she has had recurrent upper abdominal pain.  She described pain as a sharp cramping sensation, worsening after eating.  Yesterday she also has multiple episodes of loose stools.  She endorsed chills.  Rates the pain a 7 out of 10.  Endorse nausea without vomiting.  Endorsed postprandial pain.  No specific treatment tried.  She recently finishing her menstruation.  She denies any dysuria.  She denies any chest pain shortness of breath or productive cough.  She was diagnosed with gallstones 2 years ago but never had further treatment.  She reported feeling fearful of eating as it worsened her symptoms.   Past Medical History:  Diagnosis Date   Anxiety    Bronchitis    Gall stones    Yeast infection     There are no active problems to display for this patient.   Past Surgical History:  Procedure Laterality Date   TUBAL LIGATION       OB History    Gravida  4   Para  4   Term  4   Preterm      AB      Living  4     SAB      TAB      Ectopic      Multiple      Live Births               Home Medications    Prior to Admission medications   Medication Sig Start Date End Date Taking? Authorizing Provider  azithromycin (ZITHROMAX Z-PAK) 250 MG tablet Take 2 tablets by mouth on day one followed by one tablet daily for 4 days. 10/02/18   Burgess Amor, PA-C  benzonatate (TESSALON) 100 MG capsule Take 2 capsules (200 mg total) by mouth 3 (three) times daily as needed for cough. 10/02/18   Burgess Amor, PA-C    doxycycline (VIBRAMYCIN) 100 MG capsule Take 1 capsule (100 mg total) by mouth 2 (two) times daily. One po bid x 7 days Patient not taking: Reported on 11/28/2017 02/17/16   Loren Racer, MD  fluconazole (DIFLUCAN) 150 MG tablet Take 150 mg by mouth every 30 (thirty) days.    [provider]  fluconazole (DIFLUCAN) 150 MG tablet Take one pill every three days. 11/28/17   Aliene Beams, MD  metroNIDAZOLE (FLAGYL) 500 MG tablet Take 1 tablet (500 mg total) by mouth 2 (two) times daily. One po bid x 7 days 11/30/17   Aliene Beams, MD  phenazopyridine (PYRIDIUM) 200 MG tablet Take 1 tablet (200 mg total) by mouth 3 (three) times daily as needed for pain. Patient not taking: Reported on 02/24/2016 02/17/16   Loren Racer, MD  terconazole (TERAZOL 7) 0.4 % vaginal cream Place 1 applicator vaginally at bedtime. 11/28/17   Aliene Beams, MD    Family History Family History  Problem Relation Age of Onset   Kidney Stones Father     Social History Social History   Tobacco Use   Smoking  status: Current Every Day Smoker    Packs/day: 0.05    Years: 16.00    Pack years: 0.80    Types: Cigarettes   Smokeless tobacco: Never Used  Substance Use Topics   Alcohol use: No   Drug use: No     Allergies   Patient has no known allergies.   Review of Systems Review of Systems  Gastrointestinal: Positive for abdominal pain.  All other systems reviewed and are negative.    Physical Exam Updated Vital Signs BP 132/72    Pulse 88    Temp 98.2 F (36.8 C) (Oral)    Resp 18    Ht  (1.6 m)    Wt 96.2 kg    LMP 01/07/2019    SpO2 94%    BMI 37.55 kg/m   Physical Exam Vitals signs and nursing note reviewed.  Constitutional:      General: She is not in acute distress.    Appearance: She is well-developed. She is obese.  HENT:     Head: Atraumatic.  Eyes:     Conjunctiva/sclera: Conjunctivae normal.  Neck:     Musculoskeletal: Neck supple.  Cardiovascular:     Rate and  Rhythm: Tachycardia present.  Pulmonary:     Effort: Pulmonary effort is normal.     Breath sounds: Normal breath sounds.  Abdominal:     General: Abdomen is flat.     Palpations: Abdomen is soft.     Tenderness: There is abdominal tenderness in the right upper quadrant, epigastric area and periumbilical area. Negative signs include Murphy's sign and McBurney's sign.  Skin:    Findings: No rash.  Neurological:     Mental Status: She is alert.      ED Treatments / Results  Labs (all labs ordered are listed, but only abnormal results are displayed) Labs Reviewed  COMPREHENSIVE METABOLIC PANEL - Abnormal; Notable for the following components:      Result Value   Potassium 3.1 (*)    Glucose, Bld 119 (*)    Total Bilirubin 1.3 (*)    All other components within normal limits  URINALYSIS, ROUTINE W REFLEX MICROSCOPIC - Abnormal; Notable for the following components:   Hgb urine dipstick SMALL (*)    Ketones, ur 20 (*)    All other components within normal limits  CBC WITH DIFFERENTIAL/PLATELET  LIPASE, BLOOD  PREGNANCY, URINE    EKG None  Radiology US Abdomen Limited  Result Date: 01/12/2019 CLINICAL DATA:  Worsening right upper quadrant pain and nausea over the past 4 days. EXAM: ULTRASOUND ABDOMEN LIMITED RIGHT UPPER QUADRANT COMPARISON:  CT abdomen and pelvis 02/01/2017. FINDINGS: Gallbladder: Multiple stones are identified in the gallbladder measuring up to approximately 0.7 cm in diameter. There is no gallbladder wall thickening or pericholecystic fluid. Sonographer reports negative Murphy's sign. Common bile duct: Diameter: 0.2 cm Liver: No focal lesion identified. Within normal limits in parenchymal echogenicity. Portal vein is patent on color Doppler imaging with normal direction of blood flow towards the liver. IMPRESSION: Multiple gallstones are identified but there is no ultrasound evidence of cholecystitis. The study is otherwise negative. Electronically Signed   By:  Drusilla Kanner M.D.   On: 01/12/2019 12:30    Procedures Procedures (including critical care time)  Medications Ordered in ED Medications  morphine 4 MG/ML injection 4 mg (4 mg Intravenous Given 01/12/19 0948)  ondansetron (ZOFRAN) injection 4 mg (4 mg Intravenous Given 01/12/19 0946)  sodium chloride 0.9 % bolus 1,000 mL (  0 mLs Intravenous Stopped 01/12/19 1302)  potassium chloride SA (K-DUR,KLOR-CON) CR tablet 40 mEq (40 mEq Oral Given 01/12/19 1301)     Initial Impression / Assessment and Plan / ED Course  I have reviewed the triage vital signs and the nursing notes.  Pertinent labs & imaging results that were available during my care of the patient were reviewed by me and considered in my medical decision making (see chart for details).        BP 132/72    Pulse 88    Temp 98.2 F (36.8 C) (Oral)    Resp 18    Ht 5\' 3"  (1.6 m)    Wt 96.2 kg    LMP 01/07/2019    SpO2 94%    BMI 37.55 kg/m    Final Clinical Impressions(s) / ED Diagnoses   Final diagnoses:  Biliary calculus of other site without obstruction  Hypokalemia    ED Discharge Orders         Ordered    doxycycline (VIBRAMYCIN) 100 MG capsule  2 times daily,   Status:  Discontinued     01/12/19 1259    ibuprofen (ADVIL,MOTRIN) 600 MG tablet  Every 6 hours PRN,   Status:  Discontinued     01/12/19 1259    ondansetron (ZOFRAN) 4 MG tablet  Every 6 hours     01/12/19 1422    potassium chloride SA (K-DUR,KLOR-CON) 20 MEQ tablet  Daily     01/12/19 1422         9:33 AM Patient with known history of cholelithiasis here with upper abdominal pain postprandial pain and diarrhea.  Symptoms suggestive of gallbladder etiology.  Work-up initiated, will obtain limited abdominal ultrasound for further evaluation.  Patient given IV fluid, Zofran, morphine for symptom control.  2:24 PM Labs are reassuring.  Pregnancy test is negative.  Urine without signs of urinary tract infection.  Ultrasound show evidence of  cholelithiasis without evidence of cholecystitis.  At this time patient is stable for discharge with symptomatic treatment.  She will need to call and follow-up with general surgery for outpatient management of her condition.  Recommend eating bland diet.  38 y.o. female presenting with right upper quadrant abdominal pain.  Bedside ultrasound reveals gallstones with no evidence of gallbladder wall thickening pericholecystic fluid consistent with cholelithiasis. Other differential and considered including nephrolithiasis, urinary tract infection, cholecystitis, hepatitis, pancreatitis.  Liver function tests and pancreas enzymes within normal limits, no fevers, white blood count normal.  Given these findings, less likely to be cholecystitis, choledocholithiasis, or pancreatitis.  No signs of urinary tract infection.  Pregnancy test negative.  VSS.  Will refer to surgery clinic for definitive management of symptomatic cholelithiasis.  Discussed with patient to cut down on fatty foods.  Discussed warning signs of cholecystitis and choledocholithiasis, including fevers, worsening pain, uncontrolled vomiting, yellowing of skin or eyes.  Provided prescription for nausea.  Return to emergency department urgently if new or worsening symptoms develop.  Impression: Symptomatic Cholelithiasis without evidence of cholecystitis or cholangitis Abdominal pain hypokalemia  Plan:    * Discharge from ED   * Zofran   * Refrain from fatty foods   * Surgery clinic appointment in the next few days.   * Informed to return to [facility] if has new or worsening symptoms. Expressed understanding of and agreement with plan and all questions answered.   Fayrene Helper, PA-C 01/12/19 1426    Samuel Jester, DO 01/13/19 1331

## 2019-01-12 NOTE — ED Triage Notes (Signed)
Pt c/o R sided abd pain since 0100, states she was told she needed her gallbladder removed last night but has not done so. States she is having N/D/, chills. Denies OTC medications.

## 2019-01-12 NOTE — ED Notes (Signed)
Patient transported to Ultrasound 

## 2019-02-14 ENCOUNTER — Telehealth: Payer: Self-pay

## 2019-02-14 NOTE — Telephone Encounter (Signed)
Attempted to contact patient. Regarding phone visit for symptoms. Voice mail is full. Will attempt to contact at a later date.

## 2019-05-09 DIAGNOSIS — J329 Chronic sinusitis, unspecified: Secondary | ICD-10-CM | POA: Diagnosis not present

## 2019-10-29 ENCOUNTER — Ambulatory Visit: Payer: Medicaid Other | Attending: Internal Medicine

## 2019-10-29 ENCOUNTER — Other Ambulatory Visit: Payer: Self-pay

## 2019-10-29 DIAGNOSIS — Z20822 Contact with and (suspected) exposure to covid-19: Secondary | ICD-10-CM

## 2019-10-30 LAB — NOVEL CORONAVIRUS, NAA: SARS-CoV-2, NAA: NOT DETECTED

## 2019-11-18 ENCOUNTER — Other Ambulatory Visit: Payer: Self-pay

## 2019-11-18 ENCOUNTER — Ambulatory Visit
Admission: EM | Admit: 2019-11-18 | Discharge: 2019-11-18 | Disposition: A | Discharge status: Home / Self Care | Payer: Medicaid Other | Attending: Emergency Medicine | Admitting: Emergency Medicine

## 2019-11-18 DIAGNOSIS — Z20822 Contact with and (suspected) exposure to covid-19: Secondary | ICD-10-CM | POA: Diagnosis not present

## 2019-11-18 NOTE — Discharge Instructions (Addendum)

## 2019-11-18 NOTE — ED Triage Notes (Signed)
Pt lost sense of taste and smell yesterday and wants covid test

## 2019-11-18 NOTE — ED Provider Notes (Signed)
[facility]    CSN: [id number] Arrival date & time: 11/18/19  1808      History   Chief Complaint No chief complaint on file.   HPI Diana Holmes is a 39 y.o. female.   who presents with a complaint of loss of taste and smell for the past 1 day.  Denies sick exposure to COVID, flu or strep.  Denies recent travel.  Denies aggravating or alleviating symptoms.  Denies previous COVID infection.   Denies fever, chills, fatigue, nasal congestion, rhinorrhea, sore throat, cough, SOB, wheezing, chest pain, nausea, vomiting, changes in bowel or bladder habits.    The history is provided by the patient. No language interpreter was used.    Past Medical History:  Diagnosis Date  . Anxiety   . Bronchitis   . Gall stones   . Yeast infection     There are no problems to display for this patient.   Past Surgical History:  Procedure Laterality Date  . TUBAL LIGATION      OB History    Gravida  4   Para  4   Term  4   Preterm      AB      Living  4     SAB      TAB      Ectopic      Multiple      Live Births               Home Medications    Prior to Admission medications   Medication Sig Start Date End Date Taking? Authorizing Provider  ondansetron (ZOFRAN) 4 MG tablet Take 1 tablet (4 mg total) by mouth every 6 (six) hours. 01/12/19   Domenic Moras, PA-C  potassium chloride SA (K-DUR,KLOR-CON) 20 MEQ tablet Take 1 tablet (20 mEq total) by mouth daily. 01/12/19   Domenic Moras, PA-C    Family History Family History  Problem Relation Age of Onset  . Kidney Stones Father     Social History Social History   Tobacco Use  . Smoking status: Current Every Day Smoker    Packs/day: 0.05    Years: 16.00    Pack years: 0.80    Types: Cigarettes  . Smokeless tobacco: Never Used  Substance Use Topics  . Alcohol use: No  . Drug use: No     Allergies   Patient has no known allergies.   Review of Systems Review of Systems   Constitutional: Negative.   HENT: Negative.   Respiratory: Negative.   Cardiovascular: Negative.   Gastrointestinal: Negative.   Neurological: Negative.        Loss of taste and smell  All other systems reviewed and are negative.    Physical Exam Triage Vital Signs ED Triage Vitals  Enc Vitals Group     BP 11/18/19 1823 140/89     Pulse Rate 11/18/19 1823 93     Resp 11/18/19 1823 18     Temp 11/18/19 1823 98.9 F (37.2 C)     Temp src --      SpO2 11/18/19 1823 96 %     Weight --      Height --      Head Circumference --      Peak Flow --      Pain Score 11/18/19 1821 0     Pain Loc --      Pain Edu? --      Excl. in [place]? --  No data found.  Updated Vital Signs BP 140/89   Pulse 93   Temp 98.9 F (37.2 C)   Resp 18   SpO2 96%   Visual Acuity Right Eye Distance:   Left Eye Distance:   Bilateral Distance:    Right Eye Near:   Left Eye Near:    Bilateral Near:     Physical Exam Vitals and nursing note reviewed.  Constitutional:      General: She is not in acute distress.    Appearance: Normal appearance. She is normal weight. She is not ill-appearing or toxic-appearing.  HENT:     Head: Normocephalic.     Right Ear: Tympanic membrane, ear canal and external ear normal. There is no impacted cerumen.     Left Ear: Tympanic membrane, ear canal and external ear normal. There is no impacted cerumen.     Nose: Nose normal. No congestion.     Mouth/Throat:     Mouth: Mucous membranes are moist.     Pharynx: Oropharynx is clear. No oropharyngeal exudate or posterior oropharyngeal erythema.  Cardiovascular:     Rate and Rhythm: Normal rate and regular rhythm.     Pulses: Normal pulses.     Heart sounds: Normal heart sounds. No murmur.  Pulmonary:     Effort: Pulmonary effort is normal. No respiratory distress.     Breath sounds: Normal breath sounds. No wheezing or rhonchi.  Chest:     Chest wall: No tenderness.  Abdominal:     General: Abdomen is flat.  Bowel sounds are normal. There is no distension.     Palpations: There is no mass.     Tenderness: There is no abdominal tenderness.  Skin:    Capillary Refill: Capillary refill takes less than 2 seconds.  Neurological:     General: No focal deficit present.     Mental Status: She is alert and oriented to person, place, and time.      UC Treatments / Results  Labs (all labs ordered are listed, but only abnormal results are displayed) Labs Reviewed  NOVEL CORONAVIRUS, NAA    EKG   Radiology No results found.  Procedures Procedures (including critical care time)  Medications Ordered in UC Medications - No data to display  Initial Impression / Assessment and Plan / UC Course  I have reviewed the triage vital signs and the nursing notes.  Pertinent labs & imaging results that were available during my care of the patient were reviewed by me and considered in my medical decision making (see chart for details).    COVID-19 test was ordered Advised patient to quarantine To go to ED for worsening symptom Patient verbalized understanding plan of care  Final Clinical Impressions(s) / UC Diagnoses   Final diagnoses:  Suspected COVID-19 virus infection     Discharge Instructions     COVID testing ordered.  It will take between 2-7 days for test results.  Someone will contact you regarding abnormal results.    In the meantime: You should remain isolated in your home for 10 days from symptom onset AND greater than 72 hours after symptoms resolution (absence of fever without the use of fever-reducing medication and improvement in respiratory symptoms), whichever is longer Get plenty of rest and push fluids Use medications daily for symptom relief Use OTC medications like ibuprofen or tylenol as needed fever or pain Call or go to the ED if you have any new or worsening symptoms such as fever, worsening cough,  shortness of breath, chest tightness, chest pain, turning blue,  changes in mental status, etc...     ED Prescriptions    None     PDMP not reviewed this encounter.   Durward Parcel, FNP 11/18/19 1839

## 2019-11-19 ENCOUNTER — Telehealth (HOSPITAL_COMMUNITY): Payer: Self-pay | Admitting: Emergency Medicine

## 2019-11-19 LAB — NOVEL CORONAVIRUS, NAA: SARS-CoV-2, NAA: DETECTED — AB

## 2019-11-19 NOTE — Telephone Encounter (Signed)

## 2019-11-20 ENCOUNTER — Telehealth: Payer: Self-pay | Admitting: Internal Medicine

## 2019-11-20 NOTE — Telephone Encounter (Signed)
Called to discuss with patient about Covid symptoms and the use of bamlanivimab, a monoclonal antibody infusion for those with mild to moderate Covid symptoms and at high risk of hospitalization.   Pt appears to qualify for this infusion at [facility] infusion center due to co-morbid conditions and/or member of at risk group. Need updated weight to determine if pt eligible for infusion. Also need to determine symptom duration.   Unable to reach pt. Message sent via MyChart.    Cyndee Brightly, NP-C [facility]

## 2019-12-02 ENCOUNTER — Ambulatory Visit
Admission: EM | Admit: 2019-12-02 | Discharge: 2019-12-02 | Disposition: A | Discharge status: Home / Self Care | Payer: Medicaid Other | Attending: Emergency Medicine | Admitting: Emergency Medicine

## 2019-12-02 ENCOUNTER — Other Ambulatory Visit: Payer: Self-pay

## 2019-12-02 ENCOUNTER — Ambulatory Visit (INDEPENDENT_AMBULATORY_CARE_PROVIDER_SITE_OTHER): Payer: Medicaid Other

## 2019-12-02 DIAGNOSIS — R0781 Pleurodynia: Secondary | ICD-10-CM

## 2019-12-02 DIAGNOSIS — Z8616 Personal history of COVID-19: Secondary | ICD-10-CM | POA: Diagnosis not present

## 2019-12-02 DIAGNOSIS — R0989 Other specified symptoms and signs involving the circulatory and respiratory systems: Secondary | ICD-10-CM | POA: Diagnosis not present

## 2019-12-02 DIAGNOSIS — B948 Sequelae of other specified infectious and parasitic diseases: Secondary | ICD-10-CM

## 2019-12-02 DIAGNOSIS — R05 Cough: Secondary | ICD-10-CM | POA: Diagnosis not present

## 2019-12-02 MED ORDER — ALBUTEROL SULFATE HFA 108 (90 BASE) MCG/ACT IN AERS: 1.0000 | INHALATION_SPRAY | Freq: Four times a day (QID) | RESPIRATORY_TRACT | 0 refills | Status: DC | PRN

## 2019-12-02 MED ORDER — BENZONATATE 100 MG PO CAPS: 100.0000 mg | ORAL_CAPSULE | Freq: Three times a day (TID) | ORAL | 0 refills | Status: DC

## 2019-12-02 MED ORDER — PREDNISONE 10 MG PO TABS: 20.0000 mg | ORAL_TABLET | Freq: Every day | ORAL | 0 refills | Status: DC

## 2019-12-02 NOTE — ED Provider Notes (Signed)
[facility]    CSN: [id number] Arrival date & time: 12/02/19  1507      History   Chief Complaint Chief Complaint  Patient presents with  . Cough    HPI Diana Holmes is a 39 y.o. female.   Who presented to the urgent care for complaint of worsening cough and mild shortness of breath.  Patient was diagnosed with COVID-19 infection on November 19, 2019.  Denies sick exposure to COVID, flu or strep.  Denies recent travel.  Denies aggravating or alleviating symptoms.   Denies fever, chills, fatigue, nasal congestion, rhinorrhea, sore throat,  SOB, wheezing, chest pain, nausea, vomiting, changes in bowel or bladder habits.    The history is provided by the patient. No language interpreter was used.  Cough Associated symptoms: shortness of breath     Past Medical History:  Diagnosis Date  . Anxiety   . Bronchitis   . Gall stones   . Yeast infection     There are no problems to display for this patient.   Past Surgical History:  Procedure Laterality Date  . TUBAL LIGATION      OB History    Gravida  4   Para  4   Term  4   Preterm      AB      Living  4     SAB      TAB      Ectopic      Multiple      Live Births               Home Medications    Prior to Admission medications   Medication Sig Start Date End Date Taking? Authorizing Provider  albuterol (VENTOLIN HFA) 108 (90 Base) MCG/ACT inhaler Inhale 1-2 puffs into the lungs every 6 (six) hours as needed for wheezing or shortness of breath. 12/02/19   Duston Smolenski, Zachery Dakins, FNP  benzonatate (TESSALON) 100 MG capsule Take 1 capsule (100 mg total) by mouth every 8 (eight) hours. 12/02/19   Racquelle Hyser, Zachery Dakins, FNP  ondansetron (ZOFRAN) 4 MG tablet Take 1 tablet (4 mg total) by mouth every 6 (six) hours. 01/12/19   Fayrene Helper, PA-C  potassium chloride SA (K-DUR,KLOR-CON) 20 MEQ tablet Take 1 tablet (20 mEq total) by mouth daily. 01/12/19   Fayrene Helper, PA-C  predniSONE (DELTASONE) 10 MG  tablet Take 2 tablets (20 mg total) by mouth daily. 12/02/19   AvegnoZachery Dakins, FNP    Family History Family History  Problem Relation Age of Onset  . Kidney Stones Father     Social History Social History   Tobacco Use  . Smoking status: Current Every Day Smoker    Packs/day: 0.05    Years: 16.00    Pack years: 0.80    Types: Cigarettes  . Smokeless tobacco: Never Used  Substance Use Topics  . Alcohol use: No  . Drug use: No     Allergies   Patient has no known allergies.   Review of Systems Review of Systems  Constitutional: Negative.   HENT: Negative.   Respiratory: Positive for cough and shortness of breath.   Cardiovascular: Negative.   Gastrointestinal: Negative.   Neurological: Negative.   All other systems reviewed and are negative.    Physical Exam Triage Vital Signs ED Triage Vitals  Enc Vitals Group     BP 12/02/19 1520 123/82     Pulse Rate 12/02/19 1520 92     Resp  12/02/19 1520 20     Temp 12/02/19 1520 98.5 F (36.9 C)     Temp src --      SpO2 12/02/19 1520 93 %     Weight --      Height --      Head Circumference --      Peak Flow --      Pain Score 12/02/19 1518 0     Pain Loc --      Pain Edu? --      Excl. in GC? --    No data found.  Updated Vital Signs BP 123/82   Pulse 92   Temp 98.5 F (36.9 C)   Resp 20   LMP 11/01/2019 (Approximate)   SpO2 93%   Visual Acuity Right Eye Distance:   Left Eye Distance:   Bilateral Distance:    Right Eye Near:   Left Eye Near:    Bilateral Near:     Physical Exam Vitals and nursing note reviewed.  Constitutional:      General: She is not in acute distress.    Appearance: Normal appearance. She is normal weight. She is not ill-appearing or toxic-appearing.  HENT:     Head: Normocephalic.     Right Ear: Tympanic membrane, ear canal and external ear normal. There is no impacted cerumen.     Left Ear: Tympanic membrane, ear canal and external ear normal. There is no impacted  cerumen.     Nose: Nose normal. No congestion.     Mouth/Throat:     Mouth: Mucous membranes are moist.     Pharynx: Oropharynx is clear. No oropharyngeal exudate or posterior oropharyngeal erythema.  Cardiovascular:     Rate and Rhythm: Normal rate and regular rhythm.     Pulses: Normal pulses.     Heart sounds: Normal heart sounds. No murmur.  Pulmonary:     Effort: Pulmonary effort is normal. No respiratory distress.     Breath sounds: Normal breath sounds. No wheezing or rhonchi.  Chest:     Chest wall: No tenderness.  Abdominal:     General: Abdomen is flat. Bowel sounds are normal. There is no distension.     Palpations: There is no mass.     Tenderness: There is no abdominal tenderness.  Skin:    Capillary Refill: Capillary refill takes less than 2 seconds.  Neurological:     General: No focal deficit present.     Mental Status: She is alert and oriented to person, place, and time.      UC Treatments / Results  Labs (all labs ordered are listed, but only abnormal results are displayed) Labs Reviewed - No data to display  EKG   Radiology DG Chest 2 View  Result Date: 12/02/2019 CLINICAL DATA:  Cough and shortness of post COVID. Additional history provided by technologist: Patient reports testing positive for COVID 11/18/2019, cough with thick mucus, congestion, rib pain, low O2 sats, current smoker, history of bronchitis and pneumonia EXAM: CHEST - 2 VIEW COMPARISON:  Chest radiograph 10/02/2018 FINDINGS: Heart size within normal limits. No evidence of airspace consolidation within the lungs. No pleural effusion or pneumothorax. No acute bony abnormality. A metallic clip projects in the region of the left upper abdomen, unchanged. IMPRESSION: No evidence of acute cardiopulmonary abnormality. Electronically Signed   By: Jackey Loge DO   On: 12/02/2019 15:35    Procedures Procedures (including critical care time)  Medications Ordered in UC Medications - No data  to  display  Initial Impression / Assessment and Plan / UC Course  I have reviewed the triage vital signs and the nursing notes.  Pertinent labs & imaging results that were available during my care of the patient were reviewed by me and considered in my medical decision making (see chart for details).    Patient symptom, presentation and physical exam are consistent with COVID-19 sequelae. Chest x-ray was ordered and result was negative for any acute cardiopulmonary disease. 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 Provider   benzonatate (TESSALON) 100 MG capsule Take 1 capsule (100 mg total) by mouth every 8 (eight) hours. 30 capsule Kruz Chiu S, FNP   albuterol (VENTOLIN HFA) 108 (90 Base) MCG/ACT inhaler Inhale 1-2 puffs into the lungs every 6 (six) hours as needed for wheezing or shortness of breath. 18 g Sadarius Norman, Clement Sayres S, FNP   predniSONE (DELTASONE) 10 MG tablet Take 2 tablets (20 mg total) by mouth daily. 15 tablet Calyse Murcia, Darrelyn Hillock, FNP     PDMP not reviewed this encounter.   Emerson Monte, [place] 12/02/19 1555

## 2019-12-02 NOTE — Discharge Instructions (Addendum)
Chest x-ray was negative for acute pulmonary disease Prednisone was prescribed Tessalon Perles prescribed for cough ProAir was prescribed for shortness of breath Follow-up with primary care To go to ED for worsening of symptoms

## 2019-12-02 NOTE — ED Triage Notes (Signed)
Pt had covid diagnosis on 1/26 began feeling better then developed cough this week with some mild sob

## 2020-07-01 ENCOUNTER — Ambulatory Visit
Admission: EM | Admit: 2020-07-01 | Discharge: 2020-07-01 | Disposition: A | Discharge status: Home / Self Care | Payer: Medicaid Other | Attending: Emergency Medicine | Admitting: Emergency Medicine

## 2020-07-01 DIAGNOSIS — Z20822 Contact with and (suspected) exposure to covid-19: Secondary | ICD-10-CM

## 2020-07-01 NOTE — ED Triage Notes (Signed)
covid test no s/s  °

## 2020-07-03 LAB — SARS-COV-2, NAA 2 DAY TAT

## 2020-07-03 LAB — NOVEL CORONAVIRUS, NAA: SARS-CoV-2, NAA: NOT DETECTED

## 2021-06-17 ENCOUNTER — Encounter (HOSPITAL_COMMUNITY): Payer: Self-pay | Admitting: *Deleted

## 2021-06-17 ENCOUNTER — Other Ambulatory Visit: Payer: Self-pay

## 2021-06-17 ENCOUNTER — Emergency Department (HOSPITAL_COMMUNITY): Payer: Medicaid Other

## 2021-06-17 ENCOUNTER — Emergency Department (HOSPITAL_COMMUNITY)
Admission: EM | Admit: 2021-06-17 | Discharge: 2021-06-17 | Disposition: A | Discharge status: Home / Self Care | Payer: Medicaid Other | Attending: Emergency Medicine | Admitting: Emergency Medicine

## 2021-06-17 DIAGNOSIS — R197 Diarrhea, unspecified: Secondary | ICD-10-CM | POA: Diagnosis not present

## 2021-06-17 DIAGNOSIS — F17210 Nicotine dependence, cigarettes, uncomplicated: Secondary | ICD-10-CM | POA: Insufficient documentation

## 2021-06-17 DIAGNOSIS — M25511 Pain in right shoulder: Secondary | ICD-10-CM | POA: Diagnosis not present

## 2021-06-17 DIAGNOSIS — K529 Noninfective gastroenteritis and colitis, unspecified: Secondary | ICD-10-CM | POA: Insufficient documentation

## 2021-06-17 DIAGNOSIS — R1010 Upper abdominal pain, unspecified: Secondary | ICD-10-CM | POA: Diagnosis present

## 2021-06-17 DIAGNOSIS — R1084 Generalized abdominal pain: Secondary | ICD-10-CM | POA: Diagnosis not present

## 2021-06-17 DIAGNOSIS — Z20822 Contact with and (suspected) exposure to covid-19: Secondary | ICD-10-CM | POA: Insufficient documentation

## 2021-06-17 DIAGNOSIS — K921 Melena: Secondary | ICD-10-CM | POA: Diagnosis not present

## 2021-06-17 LAB — CBC
HCT: 41.3 % (ref 36.0–46.0)
Hemoglobin: 13.8 g/dL (ref 12.0–15.0)
MCH: 31 pg (ref 26.0–34.0)
MCHC: 33.4 g/dL (ref 30.0–36.0)
MCV: 92.8 fL (ref 80.0–100.0)
Platelets: 349 10*3/uL (ref 150–400)
RBC: 4.45 MIL/uL (ref 3.87–5.11)
RDW: 12.6 % (ref 11.5–15.5)
WBC: 9.2 10*3/uL (ref 4.0–10.5)
nRBC: 0 % (ref 0.0–0.2)

## 2021-06-17 LAB — URINALYSIS, ROUTINE W REFLEX MICROSCOPIC
Bilirubin Urine: NEGATIVE
Glucose, UA: NEGATIVE mg/dL
Ketones, ur: 40 mg/dL — AB
Leukocytes,Ua: NEGATIVE
Nitrite: NEGATIVE
Specific Gravity, Urine: 1.01 (ref 1.005–1.030)
pH: 5.5 (ref 5.0–8.0)

## 2021-06-17 LAB — COMPREHENSIVE METABOLIC PANEL
ALT: 20 U/L (ref 0–44)
AST: 15 U/L (ref 15–41)
Albumin: 3.9 g/dL (ref 3.5–5.0)
Alkaline Phosphatase: 66 U/L (ref 38–126)
Anion gap: 8 (ref 5–15)
BUN: 12 mg/dL (ref 6–20)
CO2: 25 mmol/L (ref 22–32)
Calcium: 8.8 mg/dL — ABNORMAL LOW (ref 8.9–10.3)
Chloride: 104 mmol/L (ref 98–111)
Creatinine, Ser: 0.75 mg/dL (ref 0.44–1.00)
GFR, Estimated: >60 mL/min (ref >60)
Glucose, Bld: 106 mg/dL — ABNORMAL HIGH (ref 70–99)
Potassium: 3.8 mmol/L (ref 3.5–5.1)
Sodium: 137 mmol/L (ref 135–145)
Total Bilirubin: 1 mg/dL (ref 0.3–1.2)
Total Protein: 7.4 g/dL (ref 6.5–8.1)

## 2021-06-17 LAB — C DIFFICILE QUICK SCREEN W PCR REFLEX
C Diff antigen: NEGATIVE
C Diff interpretation: NOT DETECTED
C Diff toxin: NEGATIVE

## 2021-06-17 LAB — URINALYSIS, MICROSCOPIC (REFLEX)

## 2021-06-17 LAB — HCG, QUANTITATIVE, PREGNANCY: hCG, Beta Chain, Quant, S: <1 m[IU]/mL (ref <5)

## 2021-06-17 LAB — RESP PANEL BY RT-PCR (FLU A&B, COVID) ARPGX2
Influenza A by PCR: NEGATIVE
Influenza B by PCR: NEGATIVE
SARS Coronavirus 2 by RT PCR: NEGATIVE

## 2021-06-17 LAB — LIPASE, BLOOD: Lipase: 23 U/L (ref 11–51)

## 2021-06-17 MED ORDER — IOHEXOL 350 MG/ML SOLN
100.0000 mL | Freq: Once | INTRAVENOUS | Status: AC | PRN
  Administered 2021-06-17: 80 mL via INTRAVENOUS

## 2021-06-17 MED ORDER — MORPHINE SULFATE (PF) 2 MG/ML IV SOLN
2.0000 mg | Freq: Once | INTRAVENOUS | Status: AC
Start: 2021-06-17 — End: 2021-06-17
  Administered 2021-06-17: 2 mg via INTRAVENOUS
  Filled 2021-06-17: qty 1

## 2021-06-17 MED ORDER — ONDANSETRON HCL 4 MG/2ML IJ SOLN
4.0000 mg | Freq: Once | INTRAMUSCULAR | Status: AC
  Administered 2021-06-17: 4 mg via INTRAVENOUS
  Filled 2021-06-17: qty 2

## 2021-06-17 MED ORDER — OXYCODONE HCL 5 MG PO TABS: 5.0000 mg | ORAL_TABLET | Freq: Four times a day (QID) | ORAL | 0 refills | Status: DC | PRN

## 2021-06-17 MED ORDER — MORPHINE SULFATE (PF) 4 MG/ML IV SOLN
4.0000 mg | Freq: Once | INTRAVENOUS | Status: AC
Start: 2021-06-17 — End: 2021-06-17
  Administered 2021-06-17: 4 mg via INTRAVENOUS
  Filled 2021-06-17: qty 1

## 2021-06-17 MED ORDER — SODIUM CHLORIDE 0.9 % IV BOLUS
1000.0000 mL | Freq: Once | INTRAVENOUS | Status: AC
  Administered 2021-06-17: 1000 mL via INTRAVENOUS

## 2021-06-17 NOTE — ED Notes (Signed)
Pt here with complaints of diarrhea and mid abdominal pain starting Sunday and hx of gall stones.

## 2021-06-17 NOTE — ED Triage Notes (Signed)
Pt with abd pain with diarrhea since Sunday evening. Denies any recent sick contacts.  Denies any emesis.

## 2021-06-17 NOTE — ED Provider Notes (Signed)
[facility] EMERGENCY DEPARTMENT Provider Note   CSN: [id number] Arrival date & time: 06/17/21  1108     History Chief Complaint  Patient presents with   Abdominal Pain    Diana Holmes is a 40 y.o. female.  HPI  Patient is a 40 year old female with a history of cholelithiasis who presents to the emergency department due to abdominal pain for the past 4 days.  Patient states her symptoms have been constant and across the upper abdomen.  States that when her pain worsens in her abdomen she will also experience right shoulder pain.  Confirms her history of cholelithiasis.  States that she has not had any solid foods for the past 4 days due to her pain.  She has been drinking fluids without difficulty.  Denies any nausea or vomiting.  She does report associated diarrhea.  Patient also notes blood when wiping status post her diarrhea but denies any frank hematochezia.  No recent sick contacts.  Reports a history of tubal ligation but otherwise no other surgical history to her abdomen.     Past Medical History:  Diagnosis Date   Anxiety    Bronchitis    Gall stones    Yeast infection     There are no problems to display for this patient.   Past Surgical History:  Procedure Laterality Date   TUBAL LIGATION       OB History     Gravida  4   Para  4   Term  4   Preterm      AB      Living  4      SAB      IAB      Ectopic      Multiple      Live Births              Family History  Problem Relation Age of Onset   Kidney Stones Father     Social History   Tobacco Use   Smoking status: Every Day    Packs/day: 0.05    Years: 16.00    Pack years: 0.80    Types: Cigarettes   Smokeless tobacco: Never  Vaping Use   Vaping Use: Never used  Substance Use Topics   Alcohol use: No   Drug use: No    Home Medications Prior to Admission medications   Medication Sig Start Date End Date Taking? Authorizing Provider  oxyCODONE (ROXICODONE) 5 MG  immediate release tablet Take 1 tablet (5 mg total) by mouth every 6 (six) hours as needed for severe pain. 06/17/21  Yes Placido Sou, PA-C  albuterol (VENTOLIN HFA) 108 (90 Base) MCG/ACT inhaler Inhale 1-2 puffs into the lungs every 6 (six) hours as needed for wheezing or shortness of breath. 12/02/19   Avegno, Zachery Dakins, FNP  benzonatate (TESSALON) 100 MG capsule Take 1 capsule (100 mg total) by mouth every 8 (eight) hours. 12/02/19   Avegno, Zachery Dakins, FNP  ondansetron (ZOFRAN) 4 MG tablet Take 1 tablet (4 mg total) by mouth every 6 (six) hours. 01/12/19   Fayrene Helper, PA-C  potassium chloride SA (K-DUR,KLOR-CON) 20 MEQ tablet Take 1 tablet (20 mEq total) by mouth daily. 01/12/19   Fayrene Helper, PA-C  predniSONE (DELTASONE) 10 MG tablet Take 2 tablets (20 mg total) by mouth daily. 12/02/19   Durward Parcel, FNP    Allergies    Patient has no known allergies.  Review of Systems   Review of  Systems  All other systems reviewed and are negative. Ten systems reviewed and are negative for acute change, except as noted in the HPI.   Physical Exam Updated Vital Signs BP 111/60   Pulse 88   Temp 98.1 F (36.7 C) (Oral)   Resp 16   Ht 5\' 3"  (1.6 m)   Wt 90.9 kg   LMP 06/09/2021   SpO2 98%   BMI 35.48 kg/m   Physical Exam Vitals and nursing note reviewed.  Constitutional:      General: She is not in acute distress.    Appearance: Normal appearance. She is not ill-appearing, toxic-appearing or diaphoretic.  HENT:     Head: Normocephalic and atraumatic.     Right Ear: External ear normal.     Left Ear: External ear normal.     Nose: Nose normal.     Mouth/Throat:     Mouth: Mucous membranes are moist.     Pharynx: Oropharynx is clear. No oropharyngeal exudate or posterior oropharyngeal erythema.  Eyes:     Extraocular Movements: Extraocular movements intact.  Cardiovascular:     Rate and Rhythm: Normal rate and regular rhythm.     Pulses: Normal pulses.     Heart sounds: Normal  heart sounds. No murmur heard.   No friction rub. No gallop.  Pulmonary:     Effort: Pulmonary effort is normal. No respiratory distress.     Breath sounds: Normal breath sounds. No stridor. No wheezing, rhonchi or rales.  Abdominal:     General: Abdomen is flat and protuberant.     Palpations: Abdomen is soft.     Tenderness: There is abdominal tenderness in the right upper quadrant, epigastric area and left upper quadrant.     Comments: Abdomen is protuberant and soft.  Diffuse tenderness across the upper abdomen that appears to be worst along the epigastrium.  Musculoskeletal:        General: Normal range of motion.     Cervical back: Normal range of motion and neck supple. No tenderness.  Skin:    General: Skin is warm and dry.  Neurological:     General: No focal deficit present.     Mental Status: She is alert and oriented to person, place, and time.  Psychiatric:        Mood and Affect: Mood normal.        Behavior: Behavior normal.    ED Results / Procedures / Treatments   Labs (all labs ordered are listed, but only abnormal results are displayed) Labs Reviewed  COMPREHENSIVE METABOLIC PANEL - Abnormal; Notable for the following components:      Result Value   Glucose, Bld 106 (*)    Calcium 8.8 (*)    All other components within normal limits  RESP PANEL BY RT-PCR (FLU A&B, COVID) ARPGX2  GASTROINTESTINAL PANEL BY PCR, STOOL (REPLACES STOOL CULTURE)  C DIFFICILE QUICK SCREEN W PCR REFLEX    LIPASE, BLOOD  CBC  HCG, QUANTITATIVE, PREGNANCY  URINALYSIS, ROUTINE W REFLEX MICROSCOPIC   EKG None  Radiology CT ABDOMEN PELVIS W CONTRAST  Result Date: 06/17/2021 CLINICAL DATA:  40 year old female with epigastric pain and diarrhea since Sunday. EXAM: CT ABDOMEN AND PELVIS WITH CONTRAST TECHNIQUE: Multidetector CT imaging of the abdomen and pelvis was performed using the standard protocol following bolus administration of intravenous contrast. CONTRAST:  80mL OMNIPAQUE  IOHEXOL 350 MG/ML SOLN COMPARISON:  Report from CT of the abdomen and pelvis from 2019. FINDINGS: Lower chest: Incidental imaging  of the lung bases without effusion or sign of consolidative change. Hepatobiliary: No focal, suspicious hepatic lesion. No pericholecystic stranding. No biliary duct dilation. Portal vein is patent. Pancreas: Normal, without mass, inflammation or ductal dilatation. Spleen: Spleen normal size and contour without visible lesion. Adrenals/Urinary Tract: Adrenal glands are normal. Symmetric renal enhancement. No sign of hydronephrosis. No visible nephrolithiasis. Urinary bladder under distended limiting assessment. Stomach/Bowel: Stomach is unremarkable. Proximal small bowel without dilation or sign of inflammation. Terminal ileum with mural stratification, wall thickening and mild hyperenhancement. Pancolonic thickening with relative sparing of the rectum. Wall thickening and pericolonic stranding. Scattered pericolonic lymph nodes. No sign of pneumatosis. No free air. No abscess. No obstruction. Vascular/Lymphatic: Patent portal vein. Patent SMV. Normal caliber of the abdominal aorta. Smooth contour of the IVC. There is no gastrohepatic or hepatoduodenal ligament lymphadenopathy. No retroperitoneal or mesenteric lymphadenopathy. No pelvic sidewall lymphadenopathy. Reproductive: Tubal ligation clips in the pelvis. Also clips anterior to the transverse colon and beneath the LEFT hemidiaphragm with similar appearance. Clip beneath the LEFT hemidiaphragm on prior chest x-ray from 2019. No surrounding stranding. Other: No ascites. Musculoskeletal: No acute bone finding. No destructive bone process. IMPRESSION: Signs of ileitis and pancolitis. Findings could be seen in the setting of inflammatory bowel disease/Crohn's disease or in the setting of C difficile colitis. Findings are moderately severe with respect to thickening of the colon and distal small bowel. Correlate with any recent antibiotic  administration or history of repeated episodes of abdominal pain. Scattered pericolonic lymph nodes are likely reactive. Tubal ligation clips in the pelvis and in the upper abdomen which have likely migrated since previous tubal ligation. Clip beneath the LEFT hemidiaphragm at least is stable since 2019 and not associated with surrounding stranding. Electronically Signed   By: Donzetta Kohut M.D.   On: 06/17/2021 14:44    Procedures Procedures   Medications Ordered in ED Medications  sodium chloride 0.9 % bolus 1,000 mL (0 mLs Intravenous Stopped 06/17/21 1341)  morphine 4 MG/ML injection 4 mg (4 mg Intravenous Given 06/17/21 1225)  ondansetron (ZOFRAN) injection 4 mg (4 mg Intravenous Given 06/17/21 1224)  iohexol (OMNIPAQUE) 350 MG/ML injection 100 mL (80 mLs Intravenous Contrast Given 06/17/21 1411)  morphine 2 MG/ML injection 2 mg (2 mg Intravenous Given 06/17/21 1734)   ED Course  I have reviewed the triage vital signs and the nursing notes.  Pertinent labs & imaging results that were available during my care of the patient were reviewed by me and considered in my medical decision making (see chart for details).  Clinical Course as of 06/17/21 1735  Thu Jun 17, 2021  1506 CT scan concerning for C. difficile colitis versus IBD.  Patient denies any recent antibiotic use.  She notes an episode of hematochezia since coming to the emergency department.  She states that she has had similar episodes of abdominal pain in the past but this is the first time that she has had symptoms of this severity. [LJ]  1558 Patient discussed with Dr. Jena Gauss.  Agrees that patient can follow-up outpatient.  Recommends we obtain a gastrointestinal panel as well as a C. difficile screen.  Recommends patient avoid NSAIDs and adhere to a low residue diet for the next few days.  Requested that she contact his office tomorrow and schedule an appointment for reevaluation. [LJ]    Clinical Course User Index [LJ] Placido Sou, PA-C   MDM Rules/Calculators/A&P  Pt is a 40 y.o. female who presents to the emergency department due to abdominal pain and diarrhea.  Labs: CBC without abnormalities. Lipase within normal limits at 23. CMP with a glucose of 106 and a calcium of 8.8. Respiratory panel is negative. Quantitative hCG less than 1. C. difficile quick screen with PCR reflex as well as gastrointestinal panel by PCR is pending.  Imaging: CT scan of the abdomen/pelvis with IV contrast shows signs of ileitis and pancolitis.  Findings could be seen in the setting of IBD/Crohn's disease or in the setting of C. difficile colitis.  Findings are moderately severe with respect to thickening of the colon and distal small bowel.  Recommend correlation with any recent antibiotic administration or a history of repeated episodes of abdominal pain.  There are scattered pericolonic lymph nodes which are likely reactive.  Tubal ligation clips in the pelvis and in the upper abdomen which have likely migrated since previous tubal ligation.  Clip beneath the left hemidiaphragm and least is stable since 2019 and not associated with surrounding stranding.  I, Placido Sou, PA-C, personally reviewed and evaluated these images and lab results as part of my medical decision-making.  Patient denies any recent antibiotic use.  Doubt C. difficile colitis at this time.  She does note some blood when wiping recently and since coming to the emergency department notes a small amount of hematochezia.  Patient was discussed with Dr. Jena Gauss with gastroenterology.  Request that we obtain a stool study for C. difficile as well as an gastrointestinal panel by PCR.  We will obtain these prior to discharge.  Request the patient refrain from NSAID use and adhere to a low residue diet for the next few days.    Lab work without leukocytosis.  Lipase within normal limits.  Calcium of 8.8 but otherwise no electrolyte  derangements.  Patient is afebrile and nontachycardic.  Feel that outpatient management is reasonable and Dr. Jena Gauss is agreeable.  Recommended that she call his office tomorrow and schedule an appointment.    This was discussed with the patient in length.  She verbalized understanding of the above plan.  Will prescribe a short course of oxycodone for breakthrough pain.  We discussed safety regarding this medication.  Feel that patient is stable for discharge at this time and she is agreeable.  Given strict return precautions.  Her questions were answered and she was amicable at the time of discharge.  Note: Portions of this report may have been transcribed using voice recognition software. Every effort was made to ensure accuracy; however, inadvertent computerized transcription errors may be present.   Final Clinical Impression(s) / ED Diagnoses Final diagnoses:  Ileitis  Colitis   Rx / DC Orders ED Discharge Orders          Ordered    oxyCODONE (ROXICODONE) 5 MG immediate release tablet  Every 6 hours PRN        06/17/21 1623             Placido Sou, PA-C 06/17/21 1736    Bethann Berkshire, MD 06/18/21 [phone]

## 2021-06-17 NOTE — Discharge Instructions (Addendum)
I have prescribed you a strong narcotic called oxycodone. Please only take this as prescribed. Do not drive or operate heavy machinery after taking this medication. Do not mix it with alcohol.   Please adhere to a low residue diet for the next few days.  This should help with your symptoms.  Please also refrain from nonsteroidal anti-inflammatories for management of your pain.  These medications include things like naproxen, Aleve, ibuprofen, or Goody powder.  Below is the contact information for Dr. Jena Gauss.  Please give him a call tomorrow and schedule an appointment for reevaluation.  Please continue to monitor your symptoms closely.  If you develop any new or worsening symptoms please come back to the emergency department.  It was a pleasure to meet you.

## 2021-06-17 NOTE — ED Notes (Signed)
Pt in ct 

## 2021-06-18 ENCOUNTER — Telehealth (HOSPITAL_COMMUNITY): Payer: Self-pay | Admitting: Physician Assistant

## 2021-06-18 ENCOUNTER — Encounter (HOSPITAL_COMMUNITY): Payer: Self-pay

## 2021-06-18 ENCOUNTER — Encounter: Payer: Self-pay | Admitting: Internal Medicine

## 2021-06-18 ENCOUNTER — Emergency Department (HOSPITAL_COMMUNITY)
Admission: EM | Admit: 2021-06-18 | Discharge: 2021-06-18 | Disposition: A | Discharge status: Home / Self Care | Payer: Medicaid Other | Attending: Emergency Medicine | Admitting: Emergency Medicine

## 2021-06-18 ENCOUNTER — Other Ambulatory Visit: Payer: Self-pay

## 2021-06-18 DIAGNOSIS — R197 Diarrhea, unspecified: Secondary | ICD-10-CM | POA: Insufficient documentation

## 2021-06-18 DIAGNOSIS — R109 Unspecified abdominal pain: Secondary | ICD-10-CM | POA: Insufficient documentation

## 2021-06-18 DIAGNOSIS — Z5321 Procedure and treatment not carried out due to patient leaving prior to being seen by health care provider: Secondary | ICD-10-CM | POA: Insufficient documentation

## 2021-06-18 LAB — GASTROINTESTINAL PANEL BY PCR, STOOL (REPLACES STOOL CULTURE)

## 2021-06-18 MED ORDER — AZITHROMYCIN 250 MG PO TABS: 500.0000 mg | ORAL_TABLET | Freq: Every day | ORAL | 0 refills | Status: AC

## 2021-06-18 NOTE — ED Triage Notes (Signed)
Pt reports abdominal pain and diarrhea since Sunday. Pt was seen yesterday at [facility] and prescribed pain medicine for colitis. Pt is back for re-evaluation.

## 2021-06-18 NOTE — ED Provider Notes (Signed)
Emergency Medicine Provider Triage Evaluation Note  Diana Holmes , a 40 y.o. female  was evaluated in triage.  Presents emergency department with epigastric abdominal pain.  Seen at [facility] yesterday for same.  States that since discharge she has had a normal bouts of diarrhea.  He needs to have blood in stool.  She was started to call GI for follow-up.  States they are unable to see her till January.  Tolerating liquid diet.  Review of Systems  Positive: Abdominal pain, diarrhea, anorexia Negative: Nausea, vomiting, syncope, lightheadedness, dizziness  Physical Exam  BP (!) 162/126 (BP Location: Left Arm)   Pulse 100   Temp 98.2 F (36.8 C) (Oral)   Resp 18   LMP 06/09/2021   SpO2 94%  Gen:   Awake, alert and oriented x3, non-toxic appearing, uncomfortable  Resp:  Normal effort. Lung sounds clear GI:  Protuberant abdomen, soft, BS present. Tenderness to palpation   Medical Decision Making  Medically screening exam initiated at 1:57 PM.  Appropriate orders placed.  Santiaga R Plunk was informed that the remainder of the evaluation will be completed by another provider, this initial triage assessment does not replace that evaluation, and the importance of remaining in the ED until their evaluation is complete.     Cristopher Peru, PA-C 06/18/21 1410    Lorre Nick, MD 06/22/21 1450

## 2021-06-18 NOTE — ED Notes (Signed)
Pt states that she was told her doctor sent antibiotics to the pharmacy and states she is going to leave.

## 2021-06-18 NOTE — Telephone Encounter (Signed)
Patient evaluated yesterday and diagnosed with colitis as well as ileitis.  Stool studies were obtained prior to discharge.  Overnight her stool study came back and was positive for Campylobacter.  Based on up-to-date will prescribe azithromycin for this.  We will reach out to the patient to update her.

## 2021-08-24 ENCOUNTER — Encounter (HOSPITAL_COMMUNITY): Payer: Self-pay | Admitting: Emergency Medicine

## 2021-08-24 ENCOUNTER — Emergency Department (HOSPITAL_COMMUNITY): Payer: Medicaid Other

## 2021-08-24 ENCOUNTER — Emergency Department (HOSPITAL_COMMUNITY)
Admission: EM | Admit: 2021-08-24 | Discharge: 2021-08-24 | Disposition: A | Discharge status: Home / Self Care | Payer: Medicaid Other | Attending: Emergency Medicine | Admitting: Emergency Medicine

## 2021-08-24 ENCOUNTER — Other Ambulatory Visit: Payer: Self-pay

## 2021-08-24 DIAGNOSIS — R200 Anesthesia of skin: Secondary | ICD-10-CM | POA: Diagnosis not present

## 2021-08-24 DIAGNOSIS — W230XXA Caught, crushed, jammed, or pinched between moving objects, initial encounter: Secondary | ICD-10-CM | POA: Insufficient documentation

## 2021-08-24 DIAGNOSIS — S6992XA Unspecified injury of left wrist, hand and finger(s), initial encounter: Secondary | ICD-10-CM | POA: Diagnosis not present

## 2021-08-24 DIAGNOSIS — F17210 Nicotine dependence, cigarettes, uncomplicated: Secondary | ICD-10-CM | POA: Diagnosis not present

## 2021-08-24 NOTE — ED Provider Notes (Signed)
[facility] EMERGENCY DEPARTMENT Provider Note   CSN: [id number] Arrival date & time: 08/24/21  0827     History Chief Complaint  Patient presents with   Finger Injury    Patient said she closed her fingers (ring, pinky, index) in her car door trying to get her kids to school.     Diana Holmes is a 40 y.o. female presenting today with left hand pain after closing her hand in the door a few hours ago.  Reports some numbness, but mostly pain.  No lacerations, complaining of swelling and limited range of motion.  Past Medical History:  Diagnosis Date   Anxiety    Bronchitis    Gall stones    Yeast infection     There are no problems to display for this patient.   Past Surgical History:  Procedure Laterality Date   TUBAL LIGATION       OB History     Gravida  4   Para  4   Term  4   Preterm      AB      Living  4      SAB      IAB      Ectopic      Multiple      Live Births              Family History  Problem Relation Age of Onset   Kidney Stones Father     Social History   Tobacco Use   Smoking status: Every Day    Packs/day: 0.05    Years: 16.00    Pack years: 0.80    Types: Cigarettes   Smokeless tobacco: Never  Vaping Use   Vaping Use: Never used  Substance Use Topics   Alcohol use: No   Drug use: No    Home Medications Prior to Admission medications   Medication Sig Start Date End Date Taking? Authorizing Provider  albuterol (VENTOLIN HFA) 108 (90 Base) MCG/ACT inhaler Inhale 1-2 puffs into the lungs every 6 (six) hours as needed for wheezing or shortness of breath. 12/02/19   Avegno, Zachery Dakins, FNP  benzonatate (TESSALON) 100 MG capsule Take 1 capsule (100 mg total) by mouth every 8 (eight) hours. 12/02/19   Avegno, Zachery Dakins, FNP  ondansetron (ZOFRAN) 4 MG tablet Take 1 tablet (4 mg total) by mouth every 6 (six) hours. 01/12/19   Fayrene Helper, PA-C  oxyCODONE (ROXICODONE) 5 MG immediate release tablet Take 1 tablet (5 mg  total) by mouth every 6 (six) hours as needed for severe pain. 06/17/21   Placido Sou, PA-C  potassium chloride SA (K-DUR,KLOR-CON) 20 MEQ tablet Take 1 tablet (20 mEq total) by mouth daily. 01/12/19   Fayrene Helper, PA-C  predniSONE (DELTASONE) 10 MG tablet Take 2 tablets (20 mg total) by mouth daily. 12/02/19   Durward Parcel, FNP    Allergies    Patient has no known allergies.  Review of Systems   Review of Systems  Musculoskeletal:  Positive for arthralgias.  Skin:  Negative for wound.  Neurological:  Positive for numbness.   Physical Exam Updated Vital Signs BP 129/60 (BP Location: Right Arm)   Pulse (!) 104   Temp 98.6 F (37 C) (Oral)   Resp 16   Ht 5\' 3"  (1.6 m)   Wt 90.7 kg   LMP 08/05/2021 (Approximate)   SpO2 100%   BMI 35.43 kg/m   Physical Exam Vitals and nursing note reviewed.  Constitutional:      Appearance: Normal appearance.  HENT:     Head: Normocephalic and atraumatic.  Eyes:     General: No scleral icterus.    Conjunctiva/sclera: Conjunctivae normal.  Pulmonary:     Effort: Pulmonary effort is normal. No respiratory distress.  Musculoskeletal:     Comments: Patient with minor inflammation ans TTP around dorsal aspect of left hand over the MCPs.  No lacerations.  Patient is with limited flexion of her fingers due to pain.  Strong pulses.  No snuffbox tenderness.  No wrist pain.  Skin:    General: Skin is warm and dry.     Findings: No lesion or rash.  Neurological:     Mental Status: She is alert.  Psychiatric:        Mood and Affect: Mood normal.    ED Results / Procedures / Treatments   Labs (all labs ordered are listed, but only abnormal results are displayed) Labs Reviewed - No data to display  EKG None  Radiology DG Hand Complete Left  Result Date: 08/24/2021 CLINICAL DATA:  Injury.  Hand caught in car door. EXAM: LEFT HAND - COMPLETE 3+ VIEW COMPARISON:  None FINDINGS: There is no evidence of fracture or dislocation. There is  no evidence of arthropathy or other focal bone abnormality. Soft tissues are unremarkable. IMPRESSION: Negative. Electronically Signed   By: Signa Kell M.D.   On: 08/24/2021 10:05    Procedures Procedures   Medications Ordered in ED Medications - No data to display  ED Course  I have reviewed the triage vital signs and the nursing notes.  Pertinent labs & imaging results that were available during my care of the patient were reviewed by me and considered in my medical decision making (see chart for details).    MDM Rules/Calculators/A&P Patient was neurovascularly intact.  Hand x-ray negative.  Ambulatory and stable for discharge with a brace and information about this type of injury attached to her discharge papers. Final Clinical Impression(s) / ED Diagnoses Final diagnoses:  Injury of left hand, initial encounter    Rx / DC Orders Results and diagnoses were explained to the patient. Return precautions discussed in full. Patient had no additional questions and expressed complete understanding.     Saddie Benders, PA-C 08/24/21 1020    Vanetta Mulders, MD 08/25/21 1601

## 2021-08-24 NOTE — Discharge Instructions (Addendum)
Are no fractures in your hand today.  I believe you bruised the area when you close your hand and of the door.  You may use ibuprofen and Tylenol for pain control.  You have been given a brace that should help keep down your swelling. It was a pleasure to meet you and I hope that you feel better

## 2021-08-25 ENCOUNTER — Telehealth: Payer: Self-pay

## 2021-08-25 NOTE — Telephone Encounter (Signed)
Transition Care Management Unsuccessful Follow-up Telephone Call  Date of discharge and from where:  08/24/2021-[facility]   Attempts:  1st Attempt  Reason for unsuccessful TCM follow-up call:  Left voice message

## 2021-08-26 NOTE — Telephone Encounter (Signed)
Transition Care Management Unsuccessful Follow-up Telephone Call  Date of discharge and from where:  08/24/2021 from [facility]  Attempts:  2nd Attempt  Reason for unsuccessful TCM follow-up call:  Left voice message

## 2021-08-27 NOTE — Telephone Encounter (Signed)
Transition Care Management Unsuccessful Follow-up Telephone Call  Date of discharge and from where:  08/24/2021 from [facility]  Attempts:  3rd Attempt  Reason for unsuccessful TCM follow-up call:  Unable to reach patient

## 2021-10-28 ENCOUNTER — Telehealth: Payer: Self-pay

## 2021-10-28 NOTE — Telephone Encounter (Signed)
.. °  Medicaid Managed Care   Unsuccessful Outreach Note  10/28/2021 Name: Diana Holmes MRN: [id number] DOB: [birth year]  Referred by: Patient, No Pcp Per (Inactive) Reason for referral : High Risk Managed Medicaid (I called the patient today to get her scheduled with the MM Team. I left my name and number on her VM.)   An unsuccessful telephone outreach was attempted today. The patient was referred to the case management team for assistance with care management and care coordination.   Follow Up Plan: The care management team will reach out to the patient again over the next 14 days.   Weston Settle Care Guide, High Risk Medicaid Managed Care Embedded Care Coordination [employer]   [facility]

## 2021-11-03 ENCOUNTER — Other Ambulatory Visit: Payer: Self-pay | Admitting: *Deleted

## 2021-11-03 ENCOUNTER — Encounter: Payer: Self-pay | Admitting: Gastroenterology

## 2021-11-03 ENCOUNTER — Ambulatory Visit: Payer: Medicaid Other | Admitting: Gastroenterology

## 2021-11-03 NOTE — Patient Instructions (Signed)
Visit Information  Ms. Diana Holmes  - as a part of your Medicaid benefit, you are eligible for care management and care coordination services at no cost or copay. I was unable to reach you by phone today but would be happy to help you with your health related needs. Please feel free to call me @ [phone].   A member of the Managed Medicaid care management team will reach out to you again over the next 14 days.   Estanislado Emms RN, BSN [place]   [facility]

## 2021-11-03 NOTE — Patient Outreach (Signed)
Care Coordination  11/03/2021  [name] Sep 19, 1981 [id number]   Medicaid Managed Care   Unsuccessful Outreach Note  11/03/2021 Name: Diana Holmes MRN: [id number] DOB: [birth year]  Referred by: Patient, No Pcp Per (Inactive) Reason for referral : High Risk Managed Medicaid (Unsuccessful RNCM initial outreach)   An unsuccessful telephone outreach was attempted today. The patient was referred to the case management team for assistance with care management and care coordination.   Follow Up Plan: A HIPAA compliant phone message was left for the patient providing contact information and requesting a return call.   Estanislado Emms RN, BSN [place]   [facility]

## 2022-01-06 ENCOUNTER — Telehealth: Payer: Self-pay

## 2022-01-06 NOTE — Telephone Encounter (Signed)
..  Patient declines further follow up and engagement by the Managed Medicaid Team. Appropriate care team members and provider have been notified via electronic communication. The Managed Medicaid Team is available to follow up with the patient after provider conversation with the patient regarding recommendation for engagement and subsequent re-referral to the Managed Medicaid Team.    Jennifer Alley Care Guide, High Risk Medicaid Managed Care Embedded Care Coordination [place]  [facility]   

## 2022-01-19 ENCOUNTER — Other Ambulatory Visit: Payer: Self-pay

## 2022-01-19 ENCOUNTER — Encounter: Payer: Self-pay | Admitting: Emergency Medicine

## 2022-01-19 ENCOUNTER — Ambulatory Visit
Admission: EM | Admit: 2022-01-19 | Discharge: 2022-01-19 | Disposition: A | Discharge status: Home / Self Care | Payer: Medicaid Other | Attending: Family Medicine | Admitting: Family Medicine

## 2022-01-19 DIAGNOSIS — L03011 Cellulitis of right finger: Secondary | ICD-10-CM

## 2022-01-19 MED ORDER — CEPHALEXIN 500 MG PO CAPS: 500.0000 mg | ORAL_CAPSULE | Freq: Two times a day (BID) | ORAL | 0 refills | Status: DC

## 2022-01-19 NOTE — ED Triage Notes (Signed)
States she has a hang nail and tried to pull it out.  States she is not sure she got it all out.  Pain to right ring finger x 4 days ?

## 2022-01-19 NOTE — ED Provider Notes (Signed)
?[facility] ? ? ? ?CSN: [id number] ?Arrival date & time: 01/19/22  1121 ? ? ?  ? ?History   ?Chief Complaint ?No chief complaint on file. ? ? ?HPI ?Diana Holmes is a 41 y.o. female.  ? ?Presenting today with right fourth finger pain, redness, swelling, drainage at the nail edge after pulling a hangnail several days ago.  Denies fever, chills, numbness, tingling, decreased range of motion, injury to the finger otherwise.  So far not trying anything over-the-counter for symptoms. ? ? ?Past Medical History:  ?Diagnosis Date  ? Anxiety   ? Bronchitis   ? Gall stones   ? Yeast infection   ? ? ?There are no problems to display for this patient. ? ? ?Past Surgical History:  ?Procedure Laterality Date  ? TUBAL LIGATION    ? ? ?OB History   ? ? Gravida  ?4  ? Para  ?4  ? Term  ?4  ? Preterm  ?   ? AB  ?   ? Living  ?4  ?  ? ? SAB  ?   ? IAB  ?   ? Ectopic  ?   ? Multiple  ?   ? Live Births  ?   ?   ?  ?  ? ? ? ?Home Medications   ? ?Prior to Admission medications   ?Medication Sig Start Date End Date Taking? Authorizing Provider  ?cephALEXin (KEFLEX) 500 MG capsule Take 1 capsule (500 mg total) by mouth 2 (two) times daily. 01/19/22  Yes Particia Nearing, PA-C  ?albuterol (VENTOLIN HFA) 108 (90 Base) MCG/ACT inhaler Inhale 1-2 puffs into the lungs every 6 (six) hours as needed for wheezing or shortness of breath. 12/02/19   Avegno, Zachery Dakins, FNP  ?benzonatate (TESSALON) 100 MG capsule Take 1 capsule (100 mg total) by mouth every 8 (eight) hours. 12/02/19   Durward Parcel, FNP  ?ondansetron (ZOFRAN) 4 MG tablet Take 1 tablet (4 mg total) by mouth every 6 (six) hours. 01/12/19   Fayrene Helper, PA-C  ?oxyCODONE (ROXICODONE) 5 MG immediate release tablet Take 1 tablet (5 mg total) by mouth every 6 (six) hours as needed for severe pain. 06/17/21   Placido Sou, PA-C  ?potassium chloride SA (K-DUR,KLOR-CON) 20 MEQ tablet Take 1 tablet (20 mEq total) by mouth daily. 01/12/19   Fayrene Helper, PA-C  ?predniSONE  (DELTASONE) 10 MG tablet Take 2 tablets (20 mg total) by mouth daily. 12/02/19   Durward Parcel, FNP  ? ? ?Family History ?Family History  ?Problem Relation Age of Onset  ? Kidney Stones Father   ? ? ?Social History ?Social History  ? ?Tobacco Use  ? Smoking status: Former  ?  Packs/day: 0.05  ?  Years: 16.00  ?  Pack years: 0.80  ?  Types: Cigarettes  ? Smokeless tobacco: Never  ?Vaping Use  ? Vaping Use: Never used  ?Substance Use Topics  ? Alcohol use: No  ? Drug use: No  ? ? ? ?Allergies   ?Patient has no known allergies. ? ? ?Review of Systems ?Review of Systems ?Per HPI ? ?Physical Exam ?Triage Vital Signs ?ED Triage Vitals [01/19/22 1129]  ?Enc Vitals Group  ?   BP (!) 142/85  ?   Pulse Rate 92  ?   Resp 18  ?   Temp 98.5 ?F (36.9 ?C)  ?   Temp Source Oral  ?   SpO2 98 %  ?   Weight   ?  Height   ?   Head Circumference   ?   Peak Flow   ?   Pain Score 8  ?   Pain Loc   ?   Pain Edu?   ?   Excl. in [place]?   ? ?No data found. ? ?Updated Vital Signs ?BP (!) 142/85 (BP Location: Right Arm)   Pulse 92   Temp 98.5 ?F (36.9 ?C) (Oral)   Resp 18   LMP 12/06/2021   SpO2 98%  ? ?Visual Acuity ?Right Eye Distance:   ?Left Eye Distance:   ?Bilateral Distance:   ? ?Right Eye Near:   ?Left Eye Near:    ?Bilateral Near:    ? ?Physical Exam ?Vitals and nursing note reviewed.  ?Constitutional:   ?   Appearance: Normal appearance. She is not ill-appearing.  ?HENT:  ?   Head: Atraumatic.  ?Eyes:  ?   Extraocular Movements: Extraocular movements intact.  ?   Conjunctiva/sclera: Conjunctivae normal.  ?Cardiovascular:  ?   Rate and Rhythm: Normal rate and regular rhythm.  ?   Heart sounds: Normal heart sounds.  ?Pulmonary:  ?   Effort: Pulmonary effort is normal.  ?   Breath sounds: Normal breath sounds.  ?Musculoskeletal:     ?   General: Swelling and tenderness present. Normal range of motion.  ?   Cervical back: Normal range of motion and neck supple.  ?Skin: ?   General: Skin is warm.  ?   Findings: Erythema present.  ?    Comments: Nail edge of the right fourth finger edematous, erythematous with small paronychia that appears to be starting to spontaneously drain.  Range of motion full and intact  ?Neurological:  ?   Mental Status: She is alert and oriented to person, place, and time.  ?   Motor: No weakness.  ?   Gait: Gait normal.  ?   Comments: Right hand neurovascularly intact  ?Psychiatric:     ?   Mood and Affect: Mood normal.     ?   Thought Content: Thought content normal.     ?   Judgment: Judgment normal.  ? ? ? ?UC Treatments / Results  ?Labs ?(all labs ordered are listed, but only abnormal results are displayed) ?Labs Reviewed - No data to display ? ?EKG ? ? ?Radiology ?No results found. ? ?Procedures ?Procedures (including critical care time) ? ?Medications Ordered in UC ?Medications - No data to display ? ?Initial Impression / Assessment and Plan / UC Course  ?I have reviewed the triage vital signs and the nursing notes. ? ?Pertinent labs & imaging results that were available during my care of the patient were reviewed by me and considered in my medical decision making (see chart for details). ? ?  ? ?Treat with Keflex, Neosporin, Epsom salt soaks, elevation.  Discussed strict return precautions for any worsening symptoms.  No indication for I&D today as spontaneous drainage ? ?Final Clinical Impressions(s) / UC Diagnoses  ? ?Final diagnoses:  ?Paronychia of finger, right  ? ?Discharge Instructions   ?None ?  ? ?ED Prescriptions   ? ? Medication Sig Dispense Auth. Provider  ? cephALEXin (KEFLEX) 500 MG capsule Take 1 capsule (500 mg total) by mouth 2 (two) times daily. 14 capsule Particia Nearing, [place]  ? ?  ? ?PDMP not reviewed this encounter. ?  ?Particia Nearing, PA-C ?01/19/22 1205 ? ?

## 2022-03-29 IMAGING — CT CT ABD-PELV W/ CM
2 of 4 series · 15 of 46 positions shown, 17 images · IV contrast (Omnipaque or Isovue)
Comparison: Report from CT of the abdomen and pelvis from 2317.

CLINICAL DATA: 40-year-old female with epigastric pain and diarrhea
since [REDACTED].

EXAM:
CT ABDOMEN AND PELVIS WITH CONTRAST
TECHNIQUE: Multidetector CT imaging of the abdomen and pelvis was performed
using the standard protocol following bolus administration of
intravenous contrast.
CONTRAST:  80mL OMNIPAQUE IOHEXOL 350 MG/ML SOLN

[Series 2: axial st · axial · 0.70mm/px · z∈[+851,+1271]mm · 12 of 92 slices shown, 14 images]
[im 4/92  soft-tissue]
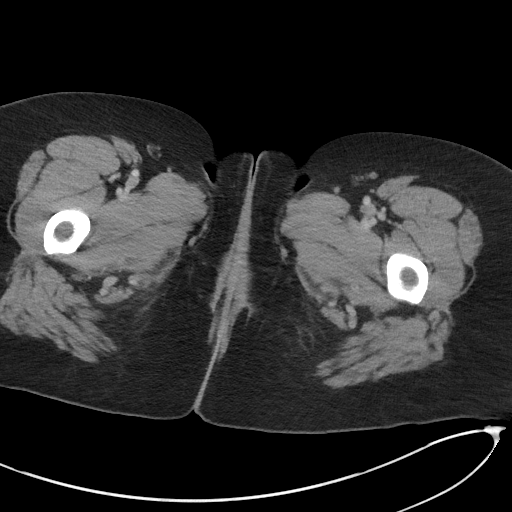
[im 4/92  bone]
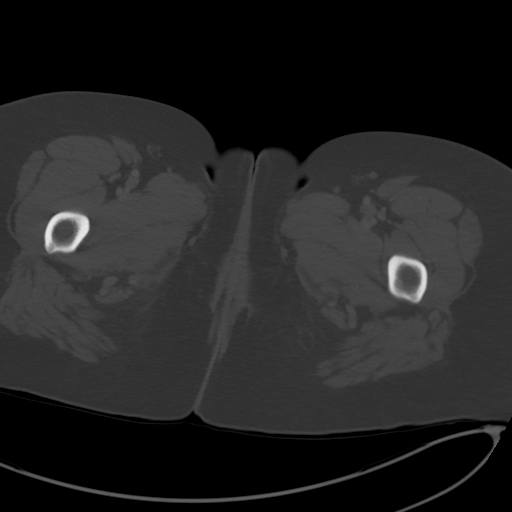
[im 12/92  soft-tissue]
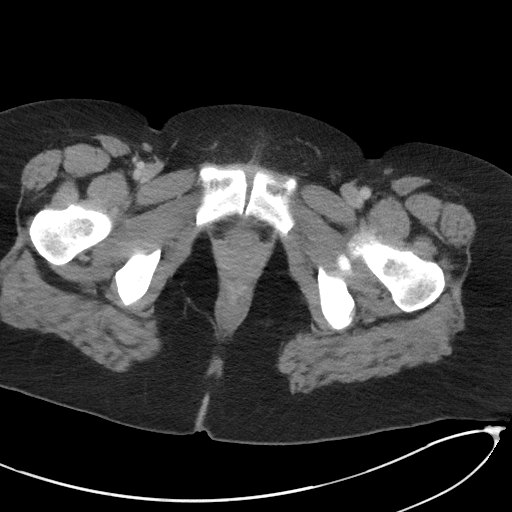
[im 20/92  soft-tissue]
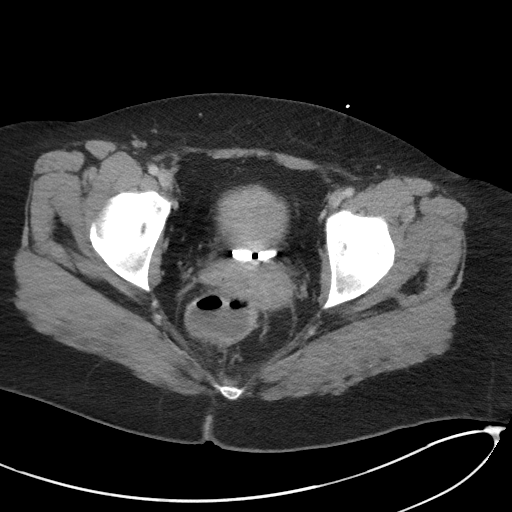
[im 28/92  soft-tissue]
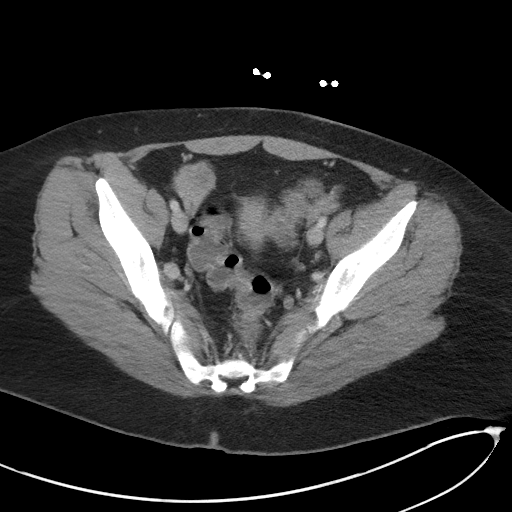
[im 36/92  soft-tissue]
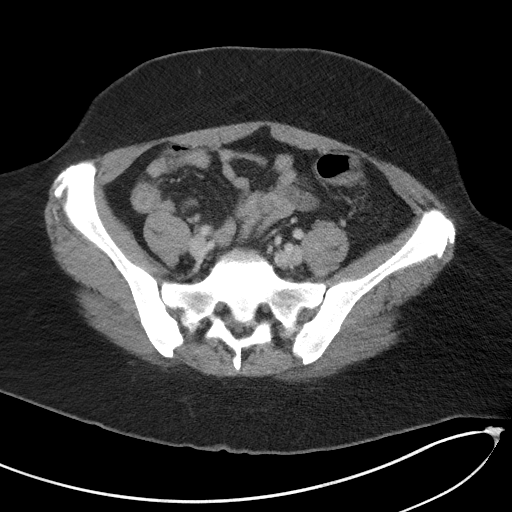
[im 44/92  soft-tissue]
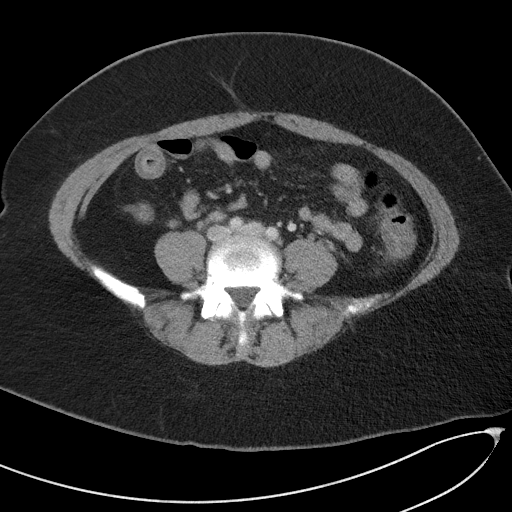
[im 48/92  soft-tissue]
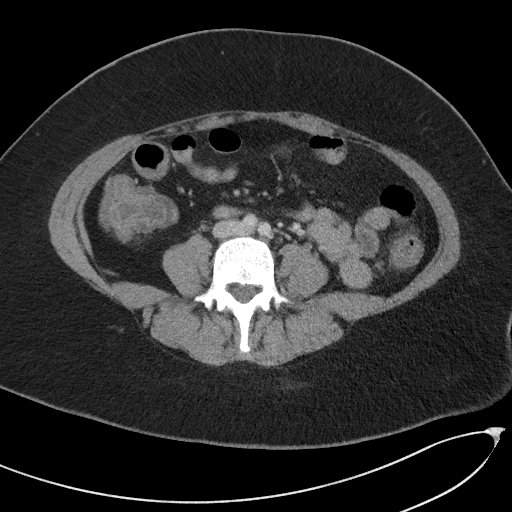
[im 56/92  soft-tissue]
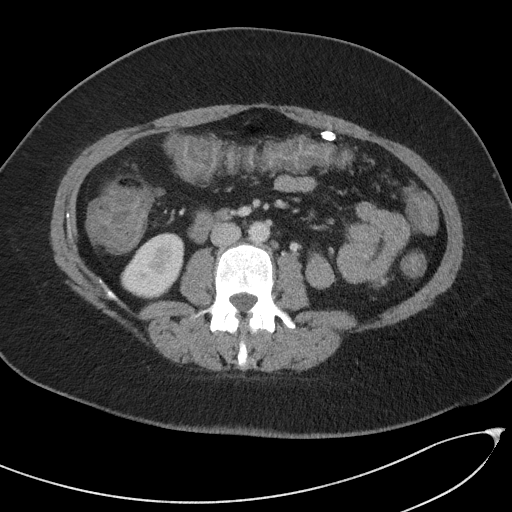
[im 64/92  soft-tissue]
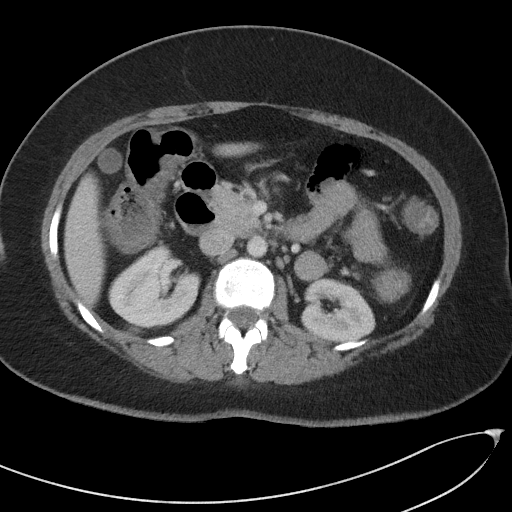
[im 64/92  bone]
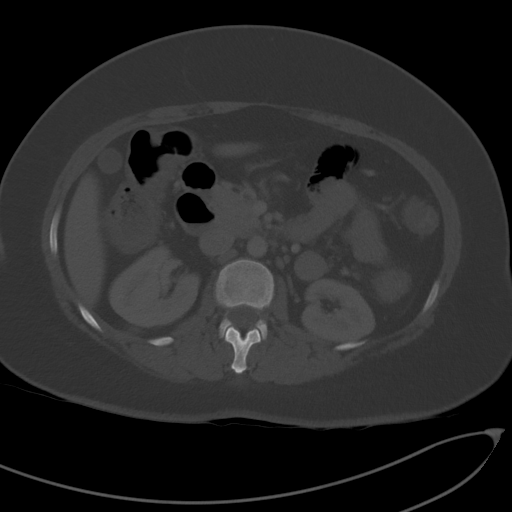
[im 72/92  soft-tissue]
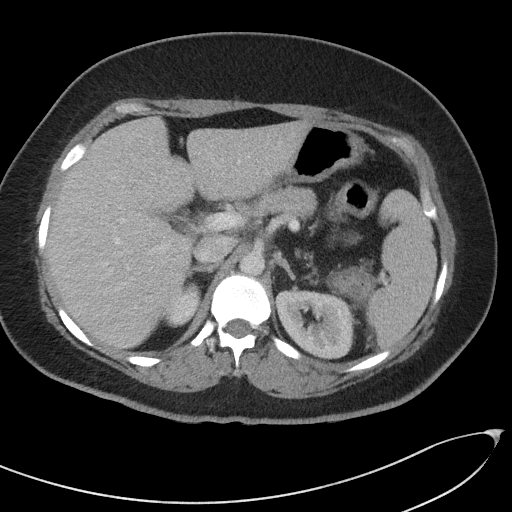
[im 80/92  soft-tissue]
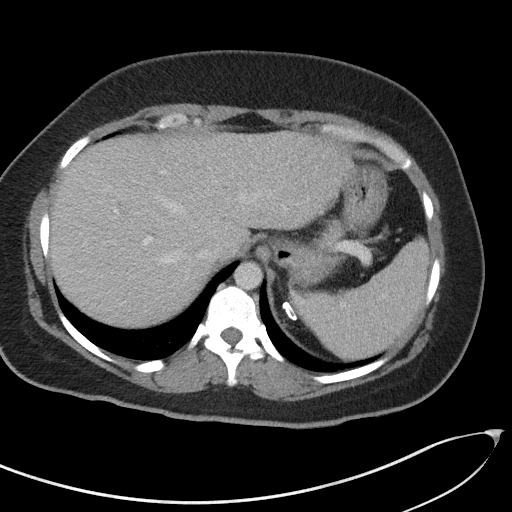
[im 88/92  soft-tissue]
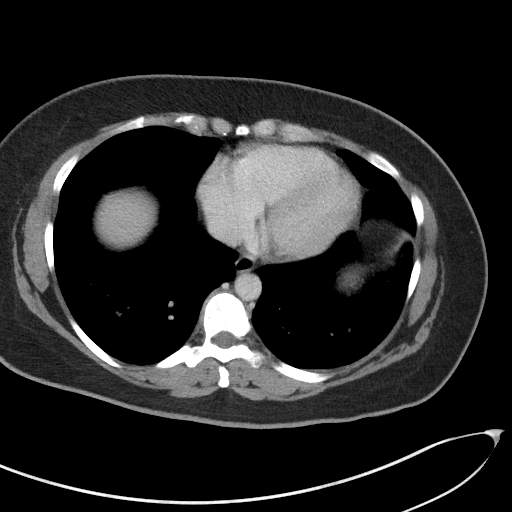

[Series 5: coronal st · coronal · 0.80mm/px · 3 of 117 slices shown]
[im 39/117  soft-tissue]
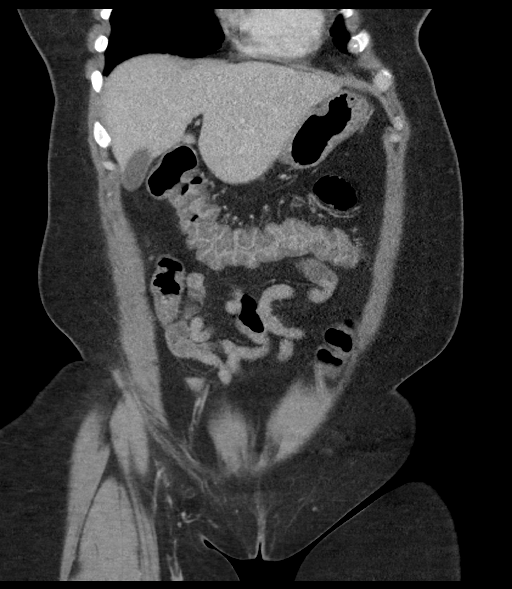
[im 52/117  soft-tissue]
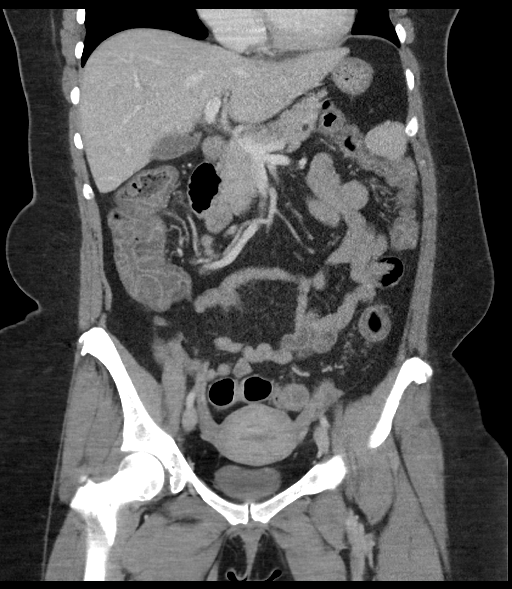
[im 65/117  soft-tissue]
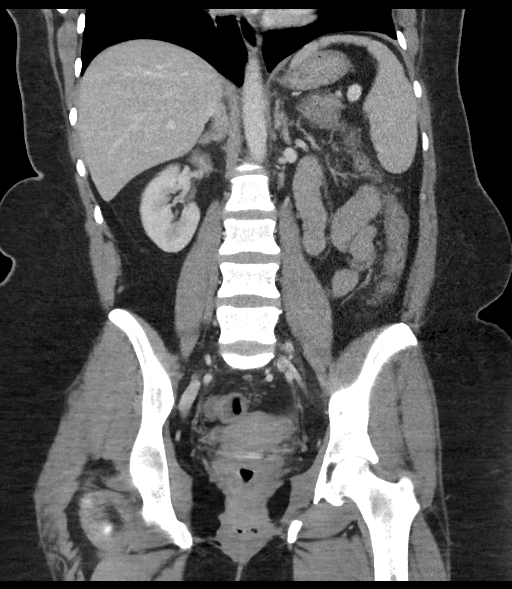

[15 of 46 positions shown; findings below may reference images not displayed]

FINDINGS: Lower chest: Incidental imaging of the lung bases without effusion
or sign of consolidative change.

Hepatobiliary: No focal, suspicious hepatic lesion. No
pericholecystic stranding. No biliary duct dilation. Portal vein is
patent.

Pancreas: Normal, without mass, inflammation or ductal dilatation.

Spleen: Spleen normal size and contour without visible lesion.

Adrenals/Urinary Tract: Adrenal glands are normal.

Symmetric renal enhancement. No sign of hydronephrosis. No visible
nephrolithiasis. Urinary bladder under distended limiting
assessment.

Stomach/Bowel: Stomach is unremarkable. Proximal small bowel without
dilation or sign of inflammation.

Terminal ileum with mural stratification, wall thickening and mild
hyperenhancement.

Pancolonic thickening with relative sparing of the rectum. Wall
thickening and pericolonic stranding. Scattered pericolonic lymph
nodes. No sign of pneumatosis. No free air. No abscess. No
obstruction.

Vascular/Lymphatic: Patent portal vein. Patent SMV. Normal caliber
of the abdominal aorta. Smooth contour of the IVC. There is no
gastrohepatic or hepatoduodenal ligament lymphadenopathy. No
retroperitoneal or mesenteric lymphadenopathy.

No pelvic sidewall lymphadenopathy.

Reproductive: Tubal ligation clips in the pelvis. Also clips
anterior to the transverse colon and beneath the LEFT hemidiaphragm
with similar appearance. Clip beneath the LEFT hemidiaphragm on
prior chest x-ray from 2317. No surrounding stranding.

Other: No ascites.

Musculoskeletal: No acute bone finding. No destructive bone process.
IMPRESSION: Signs of ileitis and pancolitis. Findings could be seen in the
setting of inflammatory bowel disease/Crohn's disease or in the
setting of C difficile colitis. Findings are moderately severe with
respect to thickening of the colon and distal small bowel. Correlate
with any recent antibiotic administration or history of repeated
episodes of abdominal pain.

Scattered pericolonic lymph nodes are likely reactive.

Tubal ligation clips in the pelvis and in the upper abdomen which
have likely migrated since previous tubal ligation. Clip beneath the
LEFT hemidiaphragm at least is stable since 2317 and not associated
with surrounding stranding.

## 2022-04-04 ENCOUNTER — Ambulatory Visit
Admission: EM | Admit: 2022-04-04 | Discharge: 2022-04-04 | Disposition: A | Discharge status: Home / Self Care | Payer: Medicaid Other | Attending: Nurse Practitioner | Admitting: Nurse Practitioner

## 2022-04-04 DIAGNOSIS — Z113 Encounter for screening for infections with a predominantly sexual mode of transmission: Secondary | ICD-10-CM

## 2022-04-04 DIAGNOSIS — N898 Other specified noninflammatory disorders of vagina: Secondary | ICD-10-CM

## 2022-04-04 LAB — POCT URINALYSIS DIP (MANUAL ENTRY)
Bilirubin, UA: NEGATIVE
Glucose, UA: NEGATIVE mg/dL
Ketones, POC UA: NEGATIVE mg/dL
Leukocytes, UA: NEGATIVE
Nitrite, UA: NEGATIVE
Protein Ur, POC: NEGATIVE mg/dL
Spec Grav, UA: 1.015 (ref 1.010–1.025)
Urobilinogen, UA: 0.2 E.U./dL
pH, UA: 7 (ref 5.0–8.0)

## 2022-04-04 NOTE — ED Provider Notes (Signed)
[facility]    CSN: [id number] Arrival date & time: 04/04/22  1605      History   Chief Complaint Chief Complaint  Patient presents with   symptoms of UTI    HPI Diana Holmes is a 41 y.o. female.   Patient presents today desiring STI testing.  Reports that she recently found out her sexual partner was unfaithful.  She denies any vaginal discharge cannot; is currently on her menses.  She occasionally has burning with urination while on her menses and would like her urine checked as well.  She denies any urinary frequency, dysuria, urgency, urinary continence, abdominal pain, nausea/vomiting, fevers.  She does have an area on the outside of her vagina that she like to have looked at-she thinks this might be a hair bump.  She denies any other sores or lesions on her genitalia.    Past Medical History:  Diagnosis Date   Anxiety    Bronchitis    Gall stones    Yeast infection     There are no problems to display for this patient.   Past Surgical History:  Procedure Laterality Date   TUBAL LIGATION      OB History     Gravida  4   Para  4   Term  4   Preterm      AB      Living  4      SAB      IAB      Ectopic      Multiple      Live Births               Home Medications    Prior to Admission medications   Medication Sig Start Date End Date Taking? Authorizing Provider  albuterol (VENTOLIN HFA) 108 (90 Base) MCG/ACT inhaler Inhale 1-2 puffs into the lungs every 6 (six) hours as needed for wheezing or shortness of breath. 12/02/19   AvegnoDarrelyn Hillock, FNP    Family History Family History  Problem Relation Age of Onset   Kidney Stones Father     Social History Social History   Tobacco Use   Smoking status: Former    Packs/day: 0.05    Years: 16.00    Total pack years: 0.80    Types: Cigarettes   Smokeless tobacco: Never  Vaping Use   Vaping Use: Never used  Substance Use Topics   Alcohol use: No   Drug use: No      Allergies   Patient has no known allergies.   Review of Systems Review of Systems Per HPI  Physical Exam Triage Vital Signs ED Triage Vitals  Enc Vitals Group     BP 04/04/22 1640 134/90     Pulse Rate 04/04/22 1638 (!) 104     Resp 04/04/22 1638 20     Temp 04/04/22 1638 98.2 F (36.8 C)     Temp Source 04/04/22 1638 Oral     SpO2 04/04/22 1638 98 %     Weight --      Height --      Head Circumference --      Peak Flow --      Pain Score 04/04/22 1636 0     Pain Loc --      Pain Edu? --      Excl. in [place]? --    No data found.  Updated Vital Signs BP 134/90 (BP Location: Right Arm)   Pulse Marland Kitchen)  104   Temp 98.2 F (36.8 C) (Oral)   Resp 20   LMP 04/03/2022 (Exact Date)   SpO2 98%   Visual Acuity Right Eye Distance:   Left Eye Distance:   Bilateral Distance:    Right Eye Near:   Left Eye Near:    Bilateral Near:     Physical Exam Vitals and nursing note reviewed. Exam conducted with a chaperone present [facility] [place], CMA).  Constitutional:      General: She is not in acute distress.    Appearance: Normal appearance. She is not toxic-appearing.  Pulmonary:     Effort: Pulmonary effort is normal. No respiratory distress.  Genitourinary:    Exam position: Lithotomy position.     Pubic Area: No rash.      Labia:        Right: Lesion present. No rash or tenderness.        Left: No rash, tenderness or lesion.      Vagina: Bleeding present.    Lymphadenopathy:     Lower Body: No right inguinal adenopathy. No left inguinal adenopathy.  Skin:    General: Skin is warm and dry.     Coloration: Skin is not jaundiced or pale.     Findings: No erythema.  Neurological:     Mental Status: She is alert and oriented to person, place, and time.     Motor: No weakness.     Gait: Gait normal.  Psychiatric:        Mood and Affect: Mood normal.        Behavior: Behavior is cooperative.      UC Treatments / Results  Labs (all labs ordered are listed,  but only abnormal results are displayed) Labs Reviewed  POCT URINALYSIS DIP (MANUAL ENTRY) - Abnormal; Notable for the following components:      Result Value   Clarity, UA cloudy (*)    Blood, UA large (*)    All other components within normal limits  HSV CULTURE AND TYPING  RPR  HIV ANTIBODY (ROUTINE TESTING W REFLEX)  CERVICOVAGINAL ANCILLARY ONLY    EKG   Radiology No results found.  Procedures Procedures (including critical care time)  Medications Ordered in UC Medications - No data to display  Initial Impression / Assessment and Plan / UC Course  I have reviewed the triage vital signs and the nursing notes.  Pertinent labs & imaging results that were available during my care of the patient were reviewed by me and considered in my medical decision making (see chart for details).    We will check self swab for trichomonas, gonorrhea, chlamydia, yeast vaginitis, bacterial vaginosis.  Lesion on external labia - suspect this may be folliculitis, however we will check for HSV.  Also screen for HIV and syphilis.  Treat patient as indicated. Final Clinical Impressions(s) / UC Diagnoses   Final diagnoses:  Vaginal lesion  Routine screening for STI (sexually transmitted infection)     Discharge Instructions      - The urine sample today does not show signs of infection. - We are testing you today for gonorrhea, chlamydia, trichomonas, yeast infection, bacterial vaginosis, HIV, HSV, and syphilis.   - Please use condoms with every sexual encounter       ED Prescriptions   None    PDMP not reviewed this encounter.   Eulogio Bear, NP 04/04/22 1719

## 2022-04-04 NOTE — Discharge Instructions (Addendum)
-   The urine sample today does not show signs of infection. - We are testing you today for gonorrhea, chlamydia, trichomonas, yeast infection, bacterial vaginosis, HIV, HSV, and syphilis.   - Please use condoms with every sexual encounter

## 2022-04-04 NOTE — ED Triage Notes (Signed)
Pt states she found out that her partner has been unfaithful and she would like to be tested for everything  Pt states she felt a bump on the outside of her vagina and no other symptoms

## 2022-04-05 LAB — HIV ANTIBODY (ROUTINE TESTING W REFLEX): HIV Screen 4th Generation wRfx: NONREACTIVE

## 2022-04-05 LAB — RPR: RPR Ser Ql: NONREACTIVE

## 2022-04-06 ENCOUNTER — Telehealth: Payer: Self-pay | Admitting: Emergency Medicine

## 2022-04-06 LAB — CERVICOVAGINAL ANCILLARY ONLY
Bacterial Vaginitis (gardnerella): NEGATIVE
Candida Glabrata: NEGATIVE
Candida Vaginitis: POSITIVE — AB
Chlamydia: NEGATIVE
Comment: NEGATIVE
Comment: NEGATIVE
Comment: NEGATIVE
Comment: NEGATIVE
Comment: NEGATIVE
Comment: NORMAL
Neisseria Gonorrhea: NEGATIVE
Trichomonas: NEGATIVE

## 2022-04-06 NOTE — Telephone Encounter (Signed)
Pt called and inquired that results were received in mychart today and was wondering about prescriptions for positive result. Pt aware nurse from [facility] will review and follow-up when all results have been returned and that additional day or so is needed for pending culture to result. Pt verbalized understanding.

## 2022-04-07 LAB — HSV CULTURE AND TYPING

## 2022-04-08 ENCOUNTER — Telehealth (HOSPITAL_COMMUNITY): Payer: Self-pay | Admitting: Emergency Medicine

## 2022-04-08 MED ORDER — FLUCONAZOLE 150 MG PO TABS: 150.0000 mg | ORAL_TABLET | Freq: Once | ORAL | 0 refills | Status: AC

## 2022-04-10 ENCOUNTER — Encounter: Payer: Self-pay | Admitting: Emergency Medicine

## 2022-04-10 ENCOUNTER — Other Ambulatory Visit: Payer: Self-pay

## 2022-04-10 ENCOUNTER — Ambulatory Visit
Admission: EM | Admit: 2022-04-10 | Discharge: 2022-04-10 | Disposition: A | Discharge status: Home / Self Care | Payer: Medicaid Other | Attending: Student | Admitting: Student

## 2022-04-10 DIAGNOSIS — N939 Abnormal uterine and vaginal bleeding, unspecified: Secondary | ICD-10-CM | POA: Diagnosis not present

## 2022-04-10 DIAGNOSIS — Z9851 Tubal ligation status: Secondary | ICD-10-CM | POA: Insufficient documentation

## 2022-04-10 DIAGNOSIS — Z113 Encounter for screening for infections with a predominantly sexual mode of transmission: Secondary | ICD-10-CM | POA: Diagnosis not present

## 2022-04-10 LAB — POCT URINE PREGNANCY: Preg Test, Ur: NEGATIVE

## 2022-04-10 MED ORDER — MEGESTROL ACETATE 40 MG PO TABS: 40.0000 mg | ORAL_TABLET | Freq: Two times a day (BID) | ORAL | 0 refills | Status: AC

## 2022-04-10 NOTE — ED Provider Notes (Signed)
[facility]    CSN: [id number] Arrival date & time: 04/10/22  1108      History   Chief Complaint Chief Complaint  Patient presents with   Vaginal Bleeding    HPI Diana Holmes is a 41 y.o. female presenting with heavy menstrual bleeding for 1 week.  History tubal ligation.  We last saw her on 04/04/2022 for STI screen because her partner had cheated on her, last intercourse was him was on approximately 04/02/2022; she tested negative for STIs on 6/12, but was positive for Candida, and was treated appropriately with fluconazole.  She was menstruating at that time, and today states that she has been on her period for slightly over than a week.  It is very heavy with lots of clots.  She is unable to quantify number of pads or tampons, but states "it is a LOT".  Minimal crampy lower abdominal pain consistent with menstrual cramps, denies flank pain, fever/chills, vaginal discharge, vaginal odor, vaginal rash or lesion.  Denies new partners since last STI check.  She is not followed by GYN.  HPI  Past Medical History:  Diagnosis Date   Anxiety    Bronchitis    Gall stones    Yeast infection     There are no problems to display for this patient.   Past Surgical History:  Procedure Laterality Date   TUBAL LIGATION      OB History     Gravida  4   Para  4   Term  4   Preterm      AB      Living  4      SAB      IAB      Ectopic      Multiple      Live Births               Home Medications    Prior to Admission medications   Medication Sig Start Date End Date Taking? Authorizing Provider  megestrol (MEGACE) 40 MG tablet Take 1 tablet (40 mg total) by mouth 2 (two) times daily for 21 days. 04/10/22 05/01/22 Yes Rhys Martini, PA-C  albuterol (VENTOLIN HFA) 108 (90 Base) MCG/ACT inhaler Inhale 1-2 puffs into the lungs every 6 (six) hours as needed for wheezing or shortness of breath. 12/02/19   AvegnoZachery Dakins, FNP    Family  History Family History  Problem Relation Age of Onset   Kidney Stones Father     Social History Social History   Tobacco Use   Smoking status: Former    Packs/day: 0.05    Years: 16.00    Total pack years: 0.80    Types: Cigarettes   Smokeless tobacco: Never  Vaping Use   Vaping Use: Never used  Substance Use Topics   Alcohol use: No   Drug use: No     Allergies   Patient has no known allergies.   Review of Systems Review of Systems  Genitourinary:  Positive for menstrual problem.  All other systems reviewed and are negative.    Physical Exam Triage Vital Signs ED Triage Vitals [04/10/22 1222]  Enc Vitals Group     BP (!) 145/84     Pulse Rate 91     Resp 20     Temp 97.9 F (36.6 C)     Temp Source Oral     SpO2 97 %     Weight      Height  Head Circumference      Peak Flow      Pain Score 0     Pain Loc      Pain Edu?      Excl. in GC?    No data found.  Updated Vital Signs BP (!) 145/84 (BP Location: Right Arm)   Pulse 91   Temp 97.9 F (36.6 C) (Oral)   Resp 20   LMP 04/03/2022 (Exact Date)   SpO2 97%   Visual Acuity Right Eye Distance:   Left Eye Distance:   Bilateral Distance:    Right Eye Near:   Left Eye Near:    Bilateral Near:     Physical Exam Vitals reviewed.  Constitutional:      General: She is not in acute distress.    Appearance: Normal appearance. She is not ill-appearing.  HENT:     Head: Normocephalic and atraumatic.     Mouth/Throat:     Mouth: Mucous membranes are moist.     Comments: Moist mucous membranes Eyes:     Extraocular Movements: Extraocular movements intact.     Pupils: Pupils are equal, round, and reactive to light.  Cardiovascular:     Rate and Rhythm: Normal rate and regular rhythm.     Heart sounds: Normal heart sounds.  Pulmonary:     Effort: Pulmonary effort is normal.     Breath sounds: Normal breath sounds. No wheezing, rhonchi or rales.  Abdominal:     General: Bowel sounds are  normal. There is no distension.     Palpations: Abdomen is soft. There is no mass.     Tenderness: There is no abdominal tenderness. There is no right CVA tenderness, left CVA tenderness, guarding or rebound.  Genitourinary:    Comments: deferred Skin:    General: Skin is warm.     Capillary Refill: Capillary refill takes less than 2 seconds.     Comments: Good skin turgor  Neurological:     General: No focal deficit present.     Mental Status: She is alert and oriented to person, place, and time.  Psychiatric:        Mood and Affect: Mood normal.        Behavior: Behavior normal.      UC Treatments / Results  Labs (all labs ordered are listed, but only abnormal results are displayed) Labs Reviewed  POCT URINE PREGNANCY  CERVICOVAGINAL ANCILLARY ONLY    EKG   Radiology No results found.  Procedures Procedures (including critical care time)  Medications Ordered in UC Medications - No data to display  Initial Impression / Assessment and Plan / UC Course  I have reviewed the triage vital signs and the nursing notes.  Pertinent labs & imaging results that were available during my care of the patient were reviewed by me and considered in my medical decision making (see chart for details).     This patient is a very pleasant 41 y.o. year old female presenting with heavy menstrual bleeding. Afebrile, nontachycardic, no reproducible abd pain or CVAT. History tubal ligation.  She presented to our urgent care for STI check on 6/12, her female partner had cheated on her and her last intercourse with him was 6/10.  STI check on 6/12 was negative for STI but positive for Candida, which was treated appropriately with fluconazole.  She has continued to have heavy menstrual bleeding since then.  Will check another cervical vaginal swab as it had only been 2 days since  the intercourse at last check.  I have low concern for PID today given no abdominal pain, no fevers, no flank pain.  She  is feeling well otherwise, without pallor, lightheadedness, tachycardia; low concern for iron deficiency anemia.  U-preg negative.  Will send self-swab for G/C, trich, yeast, BV testing. Declines HIV, RPR. Safe sex precautions.   Megace sent.  As she has only been menstruating for about 1 week, discussed option of watchful waiting for the next few days, and starting Megace if the bleeding does not slow.  F/u with gyn, information provided.   ED return precautions discussed. Patient verbalizes understanding and agreement.    Final Clinical Impressions(s) / UC Diagnoses   Final diagnoses:  Abnormal uterine bleeding (AUB)  History of bilateral tubal ligation  Routine screening for STI (sexually transmitted infection)     Discharge Instructions      -If symptoms persist in 3 days, start the megestrol twice daily. You must follow-up with a gynecologist before stopping this or you will have rebound bleeding. Call the gynecologist the day you start this medication to schedule a follow-up. -We'll call with abnormal lab results. Abstain from intercourse until negative results.  -Head to the emergency department if symptoms worsen, including new abdominal pain, new dizziness, new weakness, new back pain.   ED Prescriptions     Medication Sig Dispense Auth. Provider   megestrol (MEGACE) 40 MG tablet Take 1 tablet (40 mg total) by mouth 2 (two) times daily for 21 days. 42 tablet Rhys Martini, PA-C      PDMP not reviewed this encounter.   Rhys Martini, PA-C 04/10/22 1400

## 2022-04-10 NOTE — ED Triage Notes (Signed)
Pt reports vaginal bleeding x1 week with intermittent clots. Pt reports period started last Sunday and reports "its never been this heavy before." Denies pain, nausea, known fevers.

## 2022-04-10 NOTE — Discharge Instructions (Addendum)
-  If symptoms persist in 3 days, start the megestrol twice daily. You must follow-up with a gynecologist before stopping this or you will have rebound bleeding. Call the gynecologist the day you start this medication to schedule a follow-up. -We'll call with abnormal lab results. Abstain from intercourse until negative results.  -Head to the emergency department if symptoms worsen, including new abdominal pain, new dizziness, new weakness, new back pain.

## 2022-04-11 ENCOUNTER — Telehealth (HOSPITAL_COMMUNITY): Payer: Self-pay | Admitting: Emergency Medicine

## 2022-04-11 LAB — CERVICOVAGINAL ANCILLARY ONLY
Bacterial Vaginitis (gardnerella): POSITIVE — AB
Candida Glabrata: NEGATIVE
Candida Vaginitis: POSITIVE — AB
Chlamydia: NEGATIVE
Comment: NEGATIVE
Comment: NEGATIVE
Comment: NEGATIVE
Comment: NEGATIVE
Comment: NEGATIVE
Comment: NORMAL
Neisseria Gonorrhea: NEGATIVE
Trichomonas: NEGATIVE

## 2022-04-11 MED ORDER — METRONIDAZOLE 500 MG PO TABS: 500.0000 mg | ORAL_TABLET | Freq: Two times a day (BID) | ORAL | 0 refills | Status: DC

## 2022-04-11 MED ORDER — FLUCONAZOLE 150 MG PO TABS: 150.0000 mg | ORAL_TABLET | Freq: Once | ORAL | 0 refills | Status: AC

## 2022-04-12 DIAGNOSIS — Z1151 Encounter for screening for human papillomavirus (HPV): Secondary | ICD-10-CM | POA: Diagnosis not present

## 2022-04-12 DIAGNOSIS — N926 Irregular menstruation, unspecified: Secondary | ICD-10-CM | POA: Diagnosis not present

## 2022-04-12 DIAGNOSIS — Z124 Encounter for screening for malignant neoplasm of cervix: Secondary | ICD-10-CM | POA: Diagnosis not present

## 2022-04-27 DIAGNOSIS — N926 Irregular menstruation, unspecified: Secondary | ICD-10-CM | POA: Diagnosis not present

## 2022-05-13 DIAGNOSIS — R87610 Atypical squamous cells of undetermined significance on cytologic smear of cervix (ASC-US): Secondary | ICD-10-CM | POA: Diagnosis not present

## 2022-05-13 DIAGNOSIS — R87810 Cervical high risk human papillomavirus (HPV) DNA test positive: Secondary | ICD-10-CM | POA: Diagnosis not present

## 2022-07-11 ENCOUNTER — Encounter: Payer: Self-pay | Admitting: *Deleted

## 2022-07-11 ENCOUNTER — Ambulatory Visit
Admission: EM | Admit: 2022-07-11 | Discharge: 2022-07-11 | Disposition: A | Discharge status: Home / Self Care | Payer: Medicaid Other | Attending: Family Medicine | Admitting: Family Medicine

## 2022-07-11 DIAGNOSIS — J069 Acute upper respiratory infection, unspecified: Secondary | ICD-10-CM | POA: Diagnosis not present

## 2022-07-11 DIAGNOSIS — Z20822 Contact with and (suspected) exposure to covid-19: Secondary | ICD-10-CM | POA: Insufficient documentation

## 2022-07-11 LAB — RESP PANEL BY RT-PCR (FLU A&B, COVID) ARPGX2
Influenza A by PCR: NEGATIVE
Influenza B by PCR: NEGATIVE
SARS Coronavirus 2 by RT PCR: NEGATIVE

## 2022-07-11 MED ORDER — PROMETHAZINE-DM 6.25-15 MG/5ML PO SYRP: 5.0000 mL | ORAL_SOLUTION | Freq: Four times a day (QID) | ORAL | 0 refills | Status: DC | PRN

## 2022-07-11 MED ORDER — ALBUTEROL SULFATE HFA 108 (90 BASE) MCG/ACT IN AERS: 2.0000 | INHALATION_SPRAY | RESPIRATORY_TRACT | 0 refills | Status: DC | PRN

## 2022-07-11 NOTE — ED Provider Notes (Signed)
[facility]    CSN: [id number] Arrival date & time: 07/11/22  1508      History   Chief Complaint Chief Complaint  Patient presents with   Cough   Nasal Congestion    HPI Diana Holmes is a 41 y.o. female.   Patient presenting today with 3-day history of cough, nasal congestion.  Denies fatigue, body aches, chills, chest pain, shortness of breath, abdominal pain, nausea vomiting or diarrhea.  History of bronchitis, otherwise no known chronic pulmonary disease.  Taking Mucinex with no relief.    Past Medical History:  Diagnosis Date   Anxiety    Bronchitis    Gall stones    Yeast infection     There are no problems to display for this patient.   Past Surgical History:  Procedure Laterality Date   TUBAL LIGATION      OB History     Gravida  4   Para  4   Term  4   Preterm      AB      Living  4      SAB      IAB      Ectopic      Multiple      Live Births               Home Medications    Prior to Admission medications   Medication Sig Start Date End Date Taking? Authorizing Provider  promethazine-dextromethorphan (PROMETHAZINE-DM) 6.25-15 MG/5ML syrup Take 5 mLs by mouth 4 (four) times daily as needed. 07/11/22  Yes Volney American, PA-C  albuterol (VENTOLIN HFA) 108 (90 Base) MCG/ACT inhaler Inhale 2 puffs into the lungs every 4 (four) hours as needed for wheezing or shortness of breath. 07/11/22   Volney American, PA-C  metroNIDAZOLE (FLAGYL) 500 MG tablet Take 1 tablet (500 mg total) by mouth 2 (two) times daily. 04/11/22   LampteyMyrene Galas, MD    Family History Family History  Problem Relation Age of Onset   Kidney Stones Father     Social History Social History   Tobacco Use   Smoking status: Former    Packs/day: 0.05    Years: 16.00    Total pack years: 0.80    Types: Cigarettes   Smokeless tobacco: Never  Vaping Use   Vaping Use: Never used  Substance Use Topics   Alcohol use: No    Drug use: No     Allergies   Patient has no known allergies.   Review of Systems Review of Systems Per HPI  Physical Exam Triage Vital Signs ED Triage Vitals  Enc Vitals Group     BP 07/11/22 1525 135/84     Pulse Rate 07/11/22 1525 85     Resp 07/11/22 1525 20     Temp 07/11/22 1525 98.8 F (37.1 C)     Temp Source 07/11/22 1525 Oral     SpO2 07/11/22 1525 96 %     Weight --      Height --      Head Circumference --      Peak Flow --      Pain Score 07/11/22 1529 0     Pain Loc --      Pain Edu? --      Excl. in [place]? --    No data found.  Updated Vital Signs BP 135/84 (BP Location: Right Arm)   Pulse 85   Temp 98.8 F (37.1  C) (Oral)   Resp 20   LMP  (LMP Unknown)   SpO2 96%   Visual Acuity Right Eye Distance:   Left Eye Distance:   Bilateral Distance:    Right Eye Near:   Left Eye Near:    Bilateral Near:     Physical Exam Vitals and nursing note reviewed.  Constitutional:      Appearance: Normal appearance.  HENT:     Head: Atraumatic.     Right Ear: Tympanic membrane and external ear normal.     Left Ear: Tympanic membrane and external ear normal.     Nose: Rhinorrhea present.     Mouth/Throat:     Mouth: Mucous membranes are moist.     Pharynx: Posterior oropharyngeal erythema present.  Eyes:     Extraocular Movements: Extraocular movements intact.     Conjunctiva/sclera: Conjunctivae normal.  Cardiovascular:     Rate and Rhythm: Normal rate and regular rhythm.     Heart sounds: Normal heart sounds.  Pulmonary:     Effort: Pulmonary effort is normal.     Breath sounds: Normal breath sounds. No wheezing.  Musculoskeletal:        General: Normal range of motion.     Cervical back: Normal range of motion and neck supple.  Skin:    General: Skin is warm and dry.  Neurological:     Mental Status: She is alert and oriented to person, place, and time.  Psychiatric:        Mood and Affect: Mood normal.        Thought Content: Thought  content normal.      UC Treatments / Results  Labs (all labs ordered are listed, but only abnormal results are displayed) Labs Reviewed - No data to display  EKG   Radiology No results found.  Procedures Procedures (including critical care time)  Medications Ordered in UC Medications - No data to display  Initial Impression / Assessment and Plan / UC Course  I have reviewed the triage vital signs and the nursing notes.  Pertinent labs & imaging results that were available during my care of the patient were reviewed by me and considered in my medical decision making (see chart for details).     Signs and exam overall reassuring today and suggestive of a viral upper respiratory infection.  Treat with albuterol inhaler, Phenergan DM, supportive over-the-counter medications and home care. Return for worsening symptoms.  Final Clinical Impressions(s) / UC Diagnoses   Final diagnoses:  Viral URI with cough   Discharge Instructions   None    ED Prescriptions     Medication Sig Dispense Auth. Provider   albuterol (VENTOLIN HFA) 108 (90 Base) MCG/ACT inhaler Inhale 2 puffs into the lungs every 4 (four) hours as needed for wheezing or shortness of breath. [place], [place]   promethazine-dextromethorphan (PROMETHAZINE-DM) 6.25-15 MG/5ML syrup Take 5 mLs by mouth 4 (four) times daily as needed. 100 mL Volney American, [place]      PDMP not reviewed this encounter.   Volney American, [place] 07/11/22 [phone]

## 2022-07-11 NOTE — ED Triage Notes (Signed)
Pt states congestion and cough for a few days is taking mucinex and its not helping.

## 2022-10-15 ENCOUNTER — Ambulatory Visit
Admission: EM | Admit: 2022-10-15 | Discharge: 2022-10-15 | Disposition: A | Discharge status: Home / Self Care | Payer: Medicaid Other | Attending: Nurse Practitioner | Admitting: Nurse Practitioner

## 2022-10-15 ENCOUNTER — Encounter: Payer: Self-pay | Admitting: Emergency Medicine

## 2022-10-15 DIAGNOSIS — J209 Acute bronchitis, unspecified: Secondary | ICD-10-CM

## 2022-10-15 MED ORDER — FLUTICASONE PROPIONATE 50 MCG/ACT NA SUSP: 2.0000 | Freq: Every day | NASAL | 0 refills | Status: AC

## 2022-10-15 MED ORDER — PSEUDOEPH-BROMPHEN-DM 30-2-10 MG/5ML PO SYRP: 5.0000 mL | ORAL_SOLUTION | Freq: Four times a day (QID) | ORAL | 0 refills | Status: DC | PRN

## 2022-10-15 MED ORDER — PREDNISONE 20 MG PO TABS: 40.0000 mg | ORAL_TABLET | Freq: Every day | ORAL | 0 refills | Status: AC

## 2022-10-15 NOTE — Discharge Instructions (Addendum)
Take medication as prescribed. Increase fluids and allow for plenty of rest. Recommend using a humidifier in the bedroom at nighttime during sleep and sleeping elevated on pillows while cough symptoms persist. Recommend over-the-counter Tylenol or ibuprofen as needed for pain, fever, or general discomfort. As discussed, a viral illness can last from 10 to 14 days.  If your symptoms worsen before that time, or extend beyond that time, please follow-up in this clinic or with your primary care physician for further evaluation. Follow-up as needed.

## 2022-10-15 NOTE — ED Triage Notes (Signed)
Cough, chest congestion with chest pain and upper back pain.  Cough started on Sunday.  Has been taking mucinex without relief.

## 2022-10-15 NOTE — ED Provider Notes (Signed)
[facility]    CSN: [id number] Arrival date & time: 10/15/22  8250      History   Chief Complaint No chief complaint on file.   HPI Diana Holmes is a 41 y.o. female.   The history is provided by the patient.   The patient presents with a several day history of cough, chest congestion, chest pain with cough, and upper back pain.  Symptoms started over the past week.  Patient denies fever, chills, wheezing, shortness of breath, difficulty breathing.  Patient reports she has been taking Mucinex without relief.  She reports that she has tested negative for COVID at home.  Past Medical History:  Diagnosis Date   Anxiety    Bronchitis    Gall stones    Yeast infection     There are no problems to display for this patient.   Past Surgical History:  Procedure Laterality Date   TUBAL LIGATION      OB History     Gravida  4   Para  4   Term  4   Preterm      AB      Living  4      SAB      IAB      Ectopic      Multiple      Live Births               Home Medications    Prior to Admission medications   Medication Sig Start Date End Date Taking? Authorizing Provider  brompheniramine-pseudoephedrine-DM 30-2-10 MG/5ML syrup Take 5 mLs by mouth 4 (four) times daily as needed. 10/15/22  Yes Marquies Wanat-Warren, Sadie Haber, NP  predniSONE (DELTASONE) 20 MG tablet Take 2 tablets (40 mg total) by mouth daily with breakfast for 5 days. 10/15/22 10/20/22 Yes Rayne Loiseau-Warren, Sadie Haber, NP  albuterol (VENTOLIN HFA) 108 (90 Base) MCG/ACT inhaler Inhale 2 puffs into the lungs every 4 (four) hours as needed for wheezing or shortness of breath. 07/11/22   Particia Nearing, PA-C  metroNIDAZOLE (FLAGYL) 500 MG tablet Take 1 tablet (500 mg total) by mouth 2 (two) times daily. 04/11/22   Merrilee Jansky, MD  promethazine-dextromethorphan (PROMETHAZINE-DM) 6.25-15 MG/5ML syrup Take 5 mLs by mouth 4 (four) times daily as needed. 07/11/22   Particia Nearing, PA-C    Family History Family History  Problem Relation Age of Onset   Kidney Stones Father     Social History Social History   Tobacco Use   Smoking status: Former    Packs/day: 0.05    Years: 16.00    Total pack years: 0.80    Types: Cigarettes   Smokeless tobacco: Never  Vaping Use   Vaping Use: Never used  Substance Use Topics   Alcohol use: No   Drug use: No     Allergies   Patient has no known allergies.   Review of Systems Review of Systems Per HPI  Physical Exam Triage Vital Signs ED Triage Vitals  Enc Vitals Group     BP 10/15/22 0838 (!) 140/84     Pulse Rate 10/15/22 0838 95     Resp 10/15/22 0838 18     Temp 10/15/22 0838 98.7 F (37.1 C)     Temp Source 10/15/22 0838 Oral     SpO2 10/15/22 0838 98 %     Weight --      Height --      Head Circumference --  Peak Flow --      Pain Score 10/15/22 0839 6     Pain Loc --      Pain Edu? --      Excl. in [place]? --    No data found.  Updated Vital Signs BP (!) 140/84 (BP Location: Right Arm)   Pulse 95   Temp 98.7 F (37.1 C) (Oral)   Resp 18   SpO2 98%   Visual Acuity Right Eye Distance:   Left Eye Distance:   Bilateral Distance:    Right Eye Near:   Left Eye Near:    Bilateral Near:     Physical Exam Vitals and nursing note reviewed.  Constitutional:      General: She is not in acute distress.    Appearance: Normal appearance.  HENT:     Head: Normocephalic.     Right Ear: Tympanic membrane, ear canal and external ear normal.     Left Ear: Tympanic membrane, ear canal and external ear normal.     Nose: Congestion present. No rhinorrhea.     Mouth/Throat:     Mouth: Mucous membranes are moist.     Pharynx: Posterior oropharyngeal erythema present.  Eyes:     Extraocular Movements: Extraocular movements intact.     Conjunctiva/sclera: Conjunctivae normal.     Pupils: Pupils are equal, round, and reactive to light.  Cardiovascular:     Rate and Rhythm: Normal  rate and regular rhythm.     Pulses: Normal pulses.     Heart sounds: Normal heart sounds.  Pulmonary:     Effort: Pulmonary effort is normal. No respiratory distress.     Breath sounds: Normal breath sounds. No stridor. No wheezing, rhonchi or rales.  Abdominal:     General: Bowel sounds are normal.     Palpations: Abdomen is soft.     Tenderness: There is no abdominal tenderness.  Musculoskeletal:     Cervical back: Normal range of motion.  Lymphadenopathy:     Cervical: No cervical adenopathy.  Skin:    General: Skin is warm and dry.  Neurological:     General: No focal deficit present.     Mental Status: She is alert and oriented to person, place, and time.  Psychiatric:        Mood and Affect: Mood normal.        Behavior: Behavior normal.      UC Treatments / Results  Labs (all labs ordered are listed, but only abnormal results are displayed) Labs Reviewed - No data to display  EKG   Radiology No results found.  Procedures Procedures (including critical care time)  Medications Ordered in UC Medications - No data to display  Initial Impression / Assessment and Plan / UC Course  I have reviewed the triage vital signs and the nursing notes.  Pertinent labs & imaging results that were available during my care of the patient were reviewed by me and considered in my medical decision making (see chart for details).  The patient is well-appearing, she is in no acute distress, vital signs are stable.  Symptoms are consistent with acute bronchitis.  Will treat with prednisone 40 mg and with Bromfed-DM.  Patient was advised to take over-the-counter Tylenol or ibuprofen as needed for pain, fever, or general discomfort.  Supportive care recommendations were also provided to the patient to include increasing fluids, allowing for plenty of rest, and use of a humidifier in her bedroom at nighttime.  Discussed viral etiology  with the patient, providing strict return  precautions.  Patient verbalizes understanding.  All questions were answered.  Patient is stable for discharge.  Final Clinical Impressions(s) / UC Diagnoses   Final diagnoses:  Acute bronchitis, unspecified organism     Discharge Instructions      Take medication as prescribed. Increase fluids and allow for plenty of rest. Recommend using a humidifier in the bedroom at nighttime during sleep and sleeping elevated on pillows while cough symptoms persist. Recommend over-the-counter Tylenol or ibuprofen as needed for pain, fever, or general discomfort. As discussed, a viral illness can last from 10 to 14 days.  If your symptoms worsen before that time, or extend beyond that time, please follow-up in this clinic or with your primary care physician for further evaluation. Follow-up as needed.     ED Prescriptions     Medication Sig Dispense Auth. Provider   predniSONE (DELTASONE) 20 MG tablet Take 2 tablets (40 mg total) by mouth daily with breakfast for 5 days. 10 tablet Luba Matzen-Warren, Sadie Haber, NP   brompheniramine-pseudoephedrine-DM 30-2-10 MG/5ML syrup Take 5 mLs by mouth 4 (four) times daily as needed. 140 mL Rhyli Depaula-Warren, Sadie Haber, NP      PDMP not reviewed this encounter.   Abran Cantor, NP 10/15/22 [phone]

## 2023-04-27 ENCOUNTER — Encounter: Payer: Self-pay | Admitting: Emergency Medicine

## 2023-04-27 ENCOUNTER — Ambulatory Visit
Admission: EM | Admit: 2023-04-27 | Discharge: 2023-04-27 | Disposition: A | Discharge status: Home / Self Care | Payer: Medicaid Other | Attending: Family Medicine | Admitting: Family Medicine

## 2023-04-27 ENCOUNTER — Other Ambulatory Visit: Payer: Self-pay

## 2023-04-27 DIAGNOSIS — R21 Rash and other nonspecific skin eruption: Secondary | ICD-10-CM | POA: Diagnosis not present

## 2023-04-27 MED ORDER — CLOBETASOL PROPIONATE 0.05 % EX CREA: 1.0000 | TOPICAL_CREAM | Freq: Two times a day (BID) | CUTANEOUS | 0 refills | Status: AC

## 2023-04-27 NOTE — ED Provider Notes (Signed)
[facility]    CSN: [id number] Arrival date & time: 04/27/23  1404      History   Chief Complaint Chief Complaint  Patient presents with   Rash    HPI Diana Holmes is a 42 y.o. female.   Patient presenting to today with slightly irritated rash to the right upper arm for the past 4 days.  She states it is not itchy and has only felt irritated off and on sometimes.  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  No new foods, medications, soaps or hygiene products and no rashes or symptoms elsewhere.  Was concerned about a fungal infection so was trying Lotrimin cream over-the-counter with no relief.    Past Medical History:  Diagnosis Date   Anxiety    Bronchitis    Gall stones    Yeast infection     There are no problems to display for this patient.   Past Surgical History:  Procedure Laterality Date   TUBAL LIGATION      OB History     Gravida  4   Para  4   Term  4   Preterm      AB      Living  4      SAB      IAB      Ectopic      Multiple      Live Births               Home Medications    Prior to Admission medications   Medication Sig Start Date End Date Taking? Authorizing Provider  clobetasol cream (TEMOVATE) 0.05 % Apply 1 Application topically 2 (two) times daily. 04/27/23  Yes Particia Nearing, PA-C  albuterol (VENTOLIN HFA) 108 (90 Base) MCG/ACT inhaler Inhale 2 puffs into the lungs every 4 (four) hours as needed for wheezing or shortness of breath. 07/11/22   Particia Nearing, PA-C  brompheniramine-pseudoephedrine-DM 30-2-10 MG/5ML syrup Take 5 mLs by mouth 4 (four) times daily as needed. 10/15/22   Leath-Warren, Sadie Haber, NP  fluticasone (FLONASE) 50 MCG/ACT nasal spray Place 2 sprays into both nostrils daily. 10/15/22   Leath-Warren, Sadie Haber, NP  metroNIDAZOLE (FLAGYL) 500 MG tablet Take 1 tablet (500 mg  total) by mouth 2 (two) times daily. 04/11/22   Merrilee Jansky, MD  promethazine-dextromethorphan (PROMETHAZINE-DM) 6.25-15 MG/5ML syrup Take 5 mLs by mouth 4 (four) times daily as needed. 07/11/22   Particia Nearing, PA-C    Family History Family History  Problem Relation Age of Onset   Kidney Stones Father     Social History Social History   Tobacco Use   Smoking status: Former    Packs/day: 0.05    Years: 16.00    Additional pack years: 0.00    Total pack years: 0.80    Types: Cigarettes   Smokeless tobacco: Never  Vaping Use   Vaping Use: Never used  Substance Use Topics   Alcohol use: No   Drug use: No     Allergies   Patient has no known allergies.   Review of Systems Review of Systems Per HPI  Physical Exam Triage Vital Signs ED Triage Vitals  Enc Vitals Group     BP 04/27/23 1430 122/71     Pulse Rate 04/27/23 1430 96     Resp 04/27/23 1430 20  Temp 04/27/23 1430 98.9 F (37.2 C)     Temp Source 04/27/23 1430 Oral     SpO2 04/27/23 1430 96 %     Weight --      Height --      Head Circumference --      Peak Flow --      Pain Score 04/27/23 1427 0     Pain Loc --      Pain Edu? --      Excl. in GC? --    No data found.  Updated Vital Signs BP 122/71 (BP Location: Right Arm)   Pulse 96   Temp 98.9 F (37.2 C) (Oral)   Resp 20   LMP 04/05/2023 (Approximate)   SpO2 96%   Visual Acuity Right Eye Distance:   Left Eye Distance:   Bilateral Distance:    Right Eye Near:   Left Eye Near:    Bilateral Near:     Physical Exam Vitals and nursing note reviewed.  Constitutional:      Appearance: Normal appearance. She is not ill-appearing.  HENT:     Head: Atraumatic.  Eyes:     Extraocular Movements: Extraocular movements intact.     Conjunctiva/sclera: Conjunctivae normal.  Cardiovascular:     Rate and Rhythm: Normal rate and regular rhythm.     Heart sounds: Normal heart sounds.  Pulmonary:     Effort: Pulmonary effort is  normal.     Breath sounds: Normal breath sounds.  Musculoskeletal:        General: Normal range of motion.     Cervical back: Normal range of motion and neck supple.  Skin:    General: Skin is warm and dry.     Findings: Rash present.     Comments: 2 areas on the right upper arm of erythematous maculopapular clusters, nontender to palpation, not blistering, not scabbed or draining  Neurological:     Mental Status: She is alert and oriented to person, place, and time.  Psychiatric:        Mood and Affect: Mood normal.        Thought Content: Thought content normal.        Judgment: Judgment normal.      UC Treatments / Results  Labs (all labs ordered are listed, but only abnormal results are displayed) Labs Reviewed - No data to display  EKG   Radiology No results found.  Procedures Procedures (including critical care time)  Medications Ordered in UC Medications - No data to display  Initial Impression / Assessment and Plan / UC Course  I have reviewed the triage vital signs and the nursing notes.  Pertinent labs & imaging results that were available during my care of the patient were reviewed by me and considered in my medical decision making (see chart for details).     Irritant dermatitis versus abnormal presentation of shingles.  Treat with topical steroids, avoidance of any potential irritants and close follow-up for unresolving symptoms.  Final Clinical Impressions(s) / UC Diagnoses   Final diagnoses:  Rash   Discharge Instructions   None    ED Prescriptions     Medication Sig Dispense Auth. Provider   clobetasol cream (TEMOVATE) 0.05 % Apply 1 Application topically 2 (two) times daily. 30 g Particia Nearing, [place]      PDMP not reviewed this encounter.   Particia Nearing, [place] 04/27/23 1527

## 2023-04-27 NOTE — ED Triage Notes (Signed)
Pt reports rash to right upper arm since Monday. 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 Has used otc lotrimin.

## 2023-11-07 DIAGNOSIS — H6691 Otitis media, unspecified, right ear: Secondary | ICD-10-CM | POA: Diagnosis not present

## 2024-01-16 ENCOUNTER — Emergency Department (HOSPITAL_COMMUNITY)
Admission: EM | Admit: 2024-01-16 | Discharge: 2024-01-17 | Disposition: A | Discharge status: Home / Self Care | Attending: Emergency Medicine | Admitting: Emergency Medicine

## 2024-01-16 ENCOUNTER — Encounter (HOSPITAL_COMMUNITY): Payer: Self-pay

## 2024-01-16 ENCOUNTER — Other Ambulatory Visit: Payer: Self-pay

## 2024-01-16 DIAGNOSIS — R197 Diarrhea, unspecified: Secondary | ICD-10-CM | POA: Insufficient documentation

## 2024-01-16 DIAGNOSIS — R1084 Generalized abdominal pain: Secondary | ICD-10-CM | POA: Diagnosis not present

## 2024-01-16 DIAGNOSIS — R1032 Left lower quadrant pain: Secondary | ICD-10-CM | POA: Diagnosis not present

## 2024-01-16 DIAGNOSIS — R109 Unspecified abdominal pain: Secondary | ICD-10-CM | POA: Diagnosis present

## 2024-01-16 LAB — COMPREHENSIVE METABOLIC PANEL
ALT: 23 U/L (ref 0–44)
AST: 20 U/L (ref 15–41)
Albumin: 4.2 g/dL (ref 3.5–5.0)
Alkaline Phosphatase: 62 U/L (ref 38–126)
Anion gap: 11 (ref 5–15)
BUN: 18 mg/dL (ref 6–20)
CO2: 23 mmol/L (ref 22–32)
Calcium: 9.1 mg/dL (ref 8.9–10.3)
Chloride: 103 mmol/L (ref 98–111)
Creatinine, Ser: 0.73 mg/dL (ref 0.44–1.00)
GFR, Estimated: >60 mL/min (ref >60)
Glucose, Bld: 106 mg/dL — ABNORMAL HIGH (ref 70–99)
Potassium: 3.9 mmol/L (ref 3.5–5.1)
Sodium: 137 mmol/L (ref 135–145)
Total Bilirubin: 1.1 mg/dL (ref 0.0–1.2)
Total Protein: 7.5 g/dL (ref 6.5–8.1)

## 2024-01-16 LAB — CBC
HCT: 42.8 % (ref 36.0–46.0)
Hemoglobin: 14.2 g/dL (ref 12.0–15.0)
MCH: 30.9 pg (ref 26.0–34.0)
MCHC: 33.2 g/dL (ref 30.0–36.0)
MCV: 93.2 fL (ref 80.0–100.0)
Platelets: 320 10*3/uL (ref 150–400)
RBC: 4.59 MIL/uL (ref 3.87–5.11)
RDW: 12.7 % (ref 11.5–15.5)
WBC: 9.8 10*3/uL (ref 4.0–10.5)
nRBC: 0 % (ref 0.0–0.2)

## 2024-01-16 LAB — LIPASE, BLOOD: Lipase: 27 U/L (ref 11–51)

## 2024-01-16 LAB — URINALYSIS, ROUTINE W REFLEX MICROSCOPIC
Bacteria, UA: NONE SEEN
Bilirubin Urine: NEGATIVE
Glucose, UA: NEGATIVE mg/dL
Ketones, ur: NEGATIVE mg/dL
Nitrite: NEGATIVE
Protein, ur: 30 mg/dL — AB
Specific Gravity, Urine: 1.026 (ref 1.005–1.030)
pH: 5 (ref 5.0–8.0)

## 2024-01-16 LAB — PREGNANCY, URINE: Preg Test, Ur: NEGATIVE

## 2024-01-16 NOTE — ED Notes (Signed)
 Family requesting ice water for patient. Made family aware until patient is seen by Physician that patient should re frame for eating or drinking at this time.

## 2024-01-16 NOTE — ED Triage Notes (Signed)
 Patient c/o L flank pain and back pain that started at 630 this morning. Denies n/v has chronic diarrhea. Rates pain 9/10.

## 2024-01-17 ENCOUNTER — Emergency Department (HOSPITAL_COMMUNITY)

## 2024-01-17 DIAGNOSIS — R1032 Left lower quadrant pain: Secondary | ICD-10-CM | POA: Diagnosis not present

## 2024-01-17 MED ORDER — FENTANYL CITRATE (PF) 100 MCG/2ML IJ SOLN
100.0000 ug | Freq: Once | INTRAMUSCULAR | Status: AC
  Administered 2024-01-17: 100 ug via INTRAVENOUS
  Filled 2024-01-17: qty 2

## 2024-01-17 MED ORDER — ONDANSETRON HCL 4 MG/2ML IJ SOLN
4.0000 mg | Freq: Once | INTRAMUSCULAR | Status: AC
  Administered 2024-01-17: 4 mg via INTRAVENOUS
  Filled 2024-01-17: qty 2

## 2024-01-17 MED ORDER — IOHEXOL 300 MG/ML  SOLN
100.0000 mL | Freq: Once | INTRAMUSCULAR | Status: AC | PRN
  Administered 2024-01-17: 100 mL via INTRAVENOUS

## 2024-01-17 NOTE — Discharge Instructions (Signed)

## 2024-01-17 NOTE — ED Provider Notes (Signed)
 [place] [facility] AT [facility] Provider Note   CSN: [id number] Arrival date & time: 01/16/24  1914     History  Chief Complaint  Patient presents with   Abdominal Pain    Diana Holmes is a 43 y.o. female.  The history is provided by the patient.  Patient w/history of anxiety presents with abdominal pain She reports this started yesterday morning.  She reports that starting the left lower quadrant and is now in her upper quadrant and her flanks.  No fevers or vomiting but she does report acute on chronic diarrhea that is nonbloody.  No dysuria, no vaginal bleeding. Patient reports she is concerned about her gallbladder  Only previous surgery is tubal ligation She denies any history of inflammatory bowel disease Past Medical History:  Diagnosis Date   Anxiety    Bronchitis    Gall stones    Yeast infection     Home Medications Prior to Admission medications   Medication Sig Start Date End Date Taking? Authorizing Provider  clobetasol cream (TEMOVATE) 0.05 % Apply 1 Application topically 2 (two) times daily. 04/27/23   Particia Nearing, PA-C  fluticasone [facility]) 50 MCG/ACT nasal spray Place 2 sprays into both nostrils daily. 10/15/22   Leath-Warren, Sadie Haber, NP      Allergies    Patient has no known allergies.    Review of Systems   Review of Systems  Constitutional:  Negative for fever.  Gastrointestinal:  Positive for diarrhea. Negative for blood in stool.  Genitourinary:  Negative for vaginal bleeding.    Physical Exam Updated Vital Signs BP 114/81   Pulse 78   Temp 98.7 F (37.1 C) (Oral)   Resp 13   SpO2 96%  Physical Exam CONSTITUTIONAL: Well developed/well nourished, no distress holding her abdomen HEAD: Normocephalic/atraumatic EYES: EOMI/PERRL, no icterus ENMT: Mucous membranes moist NECK: supple no meningeal signs SPINE/BACK:entire spine nontender CV: S1/S2 noted, no murmurs/rubs/gallops noted LUNGS: Lungs  are clear to auscultation bilaterally, no apparent distress ABDOMEN: soft, mild diffuse tenderness, no rebound or guarding, bowel sounds noted throughout abdomen GU: Bilateral cva tenderness NEURO: Pt is awake/alert/appropriate, moves all extremitiesx4.  No facial droop.   EXTREMITIES: pulses normal/equal, full ROM SKIN: warm, color normal PSYCH: no abnormalities of mood noted, alert and oriented to situation  ED Results / Procedures / Treatments   Labs (all labs ordered are listed, but only abnormal results are displayed) Labs Reviewed  COMPREHENSIVE METABOLIC PANEL - Abnormal; Notable for the following components:      Result Value   Glucose, Bld 106 (*)    All other components within normal limits  URINALYSIS, ROUTINE W REFLEX MICROSCOPIC - Abnormal; Notable for the following components:   APPearance HAZY (*)    Hgb urine dipstick SMALL (*)    Protein, ur 30 (*)    Leukocytes,Ua TRACE (*)    All other components within normal limits  LIPASE, BLOOD  CBC  PREGNANCY, URINE    EKG None  Radiology CT ABDOMEN PELVIS W CONTRAST Result Date: 01/17/2024 CLINICAL DATA:  Left lower quadrant pain for several hours, initial encounter EXAM: CT ABDOMEN AND PELVIS WITH CONTRAST TECHNIQUE: Multidetector CT imaging of the abdomen and pelvis was performed using the standard protocol following bolus administration of intravenous contrast. RADIATION DOSE REDUCTION: This exam was performed according to the departmental dose-optimization program which includes automated exposure control, adjustment of the mA and/or kV according to patient size and/or use of iterative reconstruction technique. CONTRAST:  OMNIPAQUE IOHEXOL 300 MG/ML  SOLN COMPARISON:  06/17/2021 FINDINGS: Lower chest: No acute abnormality. Hepatobiliary: No focal liver abnormality is seen. No gallstones, gallbladder wall thickening, or biliary dilatation. Pancreas: Unremarkable. No pancreatic ductal dilatation or surrounding  inflammatory changes. Spleen: Normal in size without focal abnormality. Adrenals/Urinary Tract: Adrenal glands are within normal limits. Kidneys are well visualized bilaterally. No calculi or obstructive changes are noted. The bladder is decompressed. Stomach/Bowel: No obstructive or inflammatory changes of the colon are noted. The appendix is within normal limits. Small bowel and stomach are unremarkable. Vascular/Lymphatic: No significant vascular findings are present. No enlarged abdominal or pelvic lymph nodes. Reproductive: Uterus is within normal limits. No adnexal mass is noted. Changes of prior tubal ligation are seen. Clips have shown some migration in the pelvis when compared with the prior exam. Additionally a stable clip is noted along the medial aspect of the spleen. Other: No abdominal wall hernia or abnormality. No abdominopelvic ascites. Musculoskeletal: No acute or significant osseous findings. IMPRESSION: No acute abnormality noted. Chronic changes as described above. Electronically Signed   By: Alcide Clever M.D.   On: 01/17/2024 01:09    Procedures Procedures    Medications Ordered in ED Medications  fentaNYL (SUBLIMAZE) injection 100 mcg (100 mcg Intravenous Given 01/17/24 0043)  ondansetron (ZOFRAN) injection 4 mg (4 mg Intravenous Given 01/17/24 0041)  iohexol (OMNIPAQUE) 300 MG/ML solution 100 mL (100 mLs Intravenous Contrast Given 01/17/24 0054)    ED Course/ Medical Decision Making/ A&P Clinical Course as of 01/17/24 0126  Wed Jan 17, 2024  0125 Imaging and labs are overall unremarkable.  Unclear cause of her abdominal pain but she is safe for discharge.  Patient now but she has episodes of pain intermittently for a while and episodes of diarrhea.  Will refer to GI [DW]    Clinical Course User Index [DW] Zadie Rhine, MD                                 Medical Decision Making Amount and/or Complexity of Data Reviewed Labs: ordered. Radiology:  ordered.  Risk Prescription drug management.   This patient presents to the ED for concern of abdominal pain, this involves an extensive number of treatment options, and is a complaint that carries with it a high risk of complications and morbidity.  The differential diagnosis includes but is not limited to cholecystitis, cholelithiasis, pancreatitis, gastritis, peptic ulcer disease, appendicitis, bowel obstruction, bowel perforation, diverticulitis, ectopic pregnancy, tubo-ovarian abscess, ovarian torsion, PID    Comorbidities that complicate the patient evaluation: Patient's presentation is complicated by their history of anxiety  Social Determinants of Health: Patient's  tobacco use   increases the complexity of managing their presentation  Additional history obtained: Additional history obtained from  friend at bedside Records reviewed Care Everywhere/External Records  Lab Tests: I Ordered, and personally interpreted labs.  The pertinent results include: Labs overall unremarkable  Imaging Studies ordered: I ordered imaging studies including CT scan abdomen pelvis   I independently visualized and interpreted imaging which showed no acute findings I agree with the radiologist interpretation  Medicines ordered and prescription drug management: I ordered medication including fentanyl for pain Reevaluation of the patient after these medicines showed that the patient    improved  Reevaluation: After the interventions noted above, I reevaluated the patient and found that they have :improved  Complexity of problems addressed: Patient's presentation is most consistent with  acute presentation with potential threat to life or bodily function  Disposition: After consideration of the diagnostic results and the patient's response to treatment,  I feel that the patent would benefit from discharge   .           Final Clinical Impression(s) / ED Diagnoses Final diagnoses:   Generalized abdominal pain    Rx / DC Orders ED Discharge Orders     None         Zadie Rhine, MD 01/17/24 0126

## 2024-11-29 ENCOUNTER — Ambulatory Visit
Admission: EM | Admit: 2024-11-29 | Discharge: 2024-11-29 | Disposition: A | Discharge status: Home / Self Care | Source: Home / Self Care

## 2024-11-29 DIAGNOSIS — N898 Other specified noninflammatory disorders of vagina: Secondary | ICD-10-CM

## 2024-11-29 LAB — POCT URINE DIPSTICK
Bilirubin, UA: NEGATIVE
Glucose, UA: NEGATIVE mg/dL
Ketones, POC UA: NEGATIVE mg/dL
Leukocytes, UA: NEGATIVE
Nitrite, UA: NEGATIVE
Protein Ur, POC: NEGATIVE mg/dL
Spec Grav, UA: <=1.005 — AB
Urobilinogen, UA: 0.2 U/dL
pH, UA: 6

## 2024-11-29 MED ORDER — FLUCONAZOLE 150 MG PO TABS: ORAL_TABLET | ORAL | 0 refills | Status: AC

## 2024-11-29 MED ORDER — METRONIDAZOLE 500 MG PO TABS: 500.0000 mg | ORAL_TABLET | Freq: Two times a day (BID) | ORAL | 0 refills | Status: AC

## 2024-11-29 NOTE — ED Triage Notes (Signed)
 Pt reports she has vaginal odor, itching, and white discharge x 1 week after using scented soap and tissue.   Reports some frequent urination and low back pain

## 2024-11-29 NOTE — Discharge Instructions (Signed)
 Cytology swab is pending.  The urinalysis was negative.  You will be contacted if the pending test results are abnormal.  You will also have access to the results via MyChart. Take medication as prescribed. Refrain from sexual intercourse until test results are received. Recommend returning to your previous hygiene regimen. If your test results are negative and you are continue to experience symptoms despite treatment, recommend follow-up with gynecology for further evaluation. Follow-up as needed.

## 2024-11-29 NOTE — ED Provider Notes (Signed)
 " [facility]    CSN: [id number] Arrival date & time: 11/29/24  1335      History   Chief Complaint No chief complaint on file.   HPI Diana Holmes is a 44 y.o. female.   The history is provided by the patient.   Patient presents for several day history of vaginal itching, vaginal odor, and vaginal discharge.  She also reports some frequent urination and low back pain.  Patient denies urinary urgency, hesitancy, flank pain, hematuria, or dysuria.  She states symptoms started after she used a scented toilet paper.  States that she also used a different type of soap.  She reports that she is unsure whether or not this may be related to a sexually transmitted infection.  She reports 1 female partner over the past 90 days.  Past Medical History:  Diagnosis Date   Anxiety    Bronchitis    Gall stones    Yeast infection     There are no active problems to display for this patient.   Past Surgical History:  Procedure Laterality Date   TUBAL LIGATION      OB History     Gravida  4   Para  4   Term  4   Preterm      AB      Living  4      SAB      IAB      Ectopic      Multiple      Live Births               Home Medications    Prior to Admission medications  Medication Sig Start Date End Date Taking? Authorizing Provider  fluconazole  (DIFLUCAN ) 150 MG tablet Take 1 tablet by mouth today.  May repeat every 3 days up to 2 additional doses for persistent symptoms. 11/29/24  Yes Leath-Warren, Etta PARAS, NP  metroNIDAZOLE  (FLAGYL ) 500 MG tablet Take 1 tablet (500 mg total) by mouth 2 (two) times daily. 11/29/24  Yes Leath-Warren, Etta PARAS, NP  clobetasol  cream (TEMOVATE ) 0.05 % Apply 1 Application topically 2 (two) times daily. 04/27/23   Stuart Vernell Norris, PA-C  fluticasone  (FLONASE ) 50 MCG/ACT nasal spray Place 2 sprays into both nostrils daily. 10/15/22   Leath-Warren, Etta PARAS, NP    Family History Family History  Problem Relation  Age of Onset   Kidney Stones Father     Social History Social History[1]   Allergies   Patient has no known allergies.   Review of Systems Review of Systems Per HPI  Physical Exam Triage Vital Signs ED Triage Vitals  Encounter Vitals Group     BP 11/29/24 1519 (!) 144/91     Girls Systolic BP Percentile --      Girls Diastolic BP Percentile --      Boys Systolic BP Percentile --      Boys Diastolic BP Percentile --      Pulse Rate 11/29/24 1519 85     Resp 11/29/24 1519 16     Temp 11/29/24 1519 98.4 F (36.9 C)     Temp Source 11/29/24 1519 Oral     SpO2 11/29/24 1519 98 %     Weight --      Height --      Head Circumference --      Peak Flow --      Pain Score 11/29/24 1517 0     Pain Loc --  Pain Education --      Exclude from Growth Chart --    No data found.  Updated Vital Signs BP (!) 144/91 (BP Location: Right Arm)   Pulse 85   Temp 98.4 F (36.9 C) (Oral)   Resp 16   LMP  (LMP Unknown)   SpO2 98%   Visual Acuity Right Eye Distance:   Left Eye Distance:   Bilateral Distance:    Right Eye Near:   Left Eye Near:    Bilateral Near:     Physical Exam Vitals and nursing note reviewed.  Constitutional:      General: She is not in acute distress.    Appearance: Normal appearance.  HENT:     Head: Normocephalic.  Eyes:     Extraocular Movements: Extraocular movements intact.     Pupils: Pupils are equal, round, and reactive to light.  Cardiovascular:     Rate and Rhythm: Normal rate and regular rhythm.     Pulses: Normal pulses.     Heart sounds: Normal heart sounds.  Pulmonary:     Effort: Pulmonary effort is normal. No respiratory distress.     Breath sounds: Normal breath sounds. No stridor. No wheezing, rhonchi or rales.  Abdominal:     General: Bowel sounds are normal.     Palpations: Abdomen is soft.  Genitourinary:    Comments: GU exam deferred, self swab performed  Musculoskeletal:     Cervical back: Normal range of motion.   Neurological:     General: No focal deficit present.     Mental Status: She is alert and oriented to person, place, and time.  Psychiatric:        Mood and Affect: Mood normal.        Behavior: Behavior normal.      UC Treatments / Results  Labs (all labs ordered are listed, but only abnormal results are displayed) Labs Reviewed  POCT URINE DIPSTICK - Abnormal; Notable for the following components:      Result Value   Color, UA light yellow (*)    Spec Grav, UA <=1.005 (*)    Blood, UA trace-lysed (*)    All other components within normal limits  CERVICOVAGINAL ANCILLARY ONLY    EKG   Radiology No results found.  Procedures Procedures (including critical care time)  Medications Ordered in UC Medications - No data to display  Initial Impression / Assessment and Plan / UC Course  I have reviewed the triage vital signs and the nursing notes.  Pertinent labs & imaging results that were available during my care of the patient were reviewed by me and considered in my medical decision making (see chart for details).  Urinalysis is positive for trace blood, cytology swab is pending.  Given the patient's symptoms of vaginal itching, and vaginal discharge, will treat for BV and yeast with fluconazole  150 mg and metronidazole  500 mg.  Supportive care recommendations were provided and discussed with the patient to include refraining from sexual intercourse until test results are received and returning back to her previous hygiene products.  Patient was advised if symptoms fail to improve with this treatment, or if her test results are negative and she continues to experience symptoms, recommend follow-up with gynecology for further evaluation.  Patient was in agreement with this plan of care and verbalizes understanding.  All questions were answered.  Patient stable for discharge.  metotFinal Clinical Impressions(s) / UC Diagnoses   Final diagnoses:  Vaginal discharge  Vaginal  odor     Discharge Instructions      Cytology swab is pending.  The urinalysis was negative.  You will be contacted if the pending test results are abnormal.  You will also have access to the results via MyChart. Take medication as prescribed. Refrain from sexual intercourse until test results are received. Recommend returning to your previous hygiene regimen. If your test results are negative and you are continue to experience symptoms despite treatment, recommend follow-up with gynecology for further evaluation. Follow-up as needed.     ED Prescriptions     Medication Sig Dispense Auth. Provider   metroNIDAZOLE  (FLAGYL ) 500 MG tablet Take 1 tablet (500 mg total) by mouth 2 (two) times daily. 14 tablet Leath-Warren, Etta PARAS, NP   fluconazole  (DIFLUCAN ) 150 MG tablet Take 1 tablet by mouth today.  May repeat every 3 days up to 2 additional doses for persistent symptoms. 3 tablet Leath-Warren, Etta PARAS, NP      PDMP not reviewed this encounter.     [1]  Social History Tobacco Use   Smoking status: Former    Current packs/day: 0.05    Average packs/day: 0.1 packs/day for 16.0 years (0.8 ttl pk-yrs)    Types: Cigarettes   Smokeless tobacco: Never  Vaping Use   Vaping status: Never Used  Substance Use Topics   Alcohol use: No   Drug use: No     Gilmer Etta PARAS, NP 11/29/24 1611  "
